# Patient Record
Sex: Male | Born: 1965
Health system: Southern US, Community
[De-identification: ages and names within clinical notes are randomized; demographics above are authoritative.]

## PROBLEM LIST (undated history)

## (undated) DIAGNOSIS — E119 Type 2 diabetes mellitus without complications: Secondary | ICD-10-CM

## (undated) DIAGNOSIS — I10 Essential (primary) hypertension: Secondary | ICD-10-CM

## (undated) HISTORY — DX: Essential (primary) hypertension: I10

## (undated) HISTORY — PX: SHOULDER SURGERY: SHX246

---

## 2002-01-15 ENCOUNTER — Emergency Department: Admission: EM | Admit: 2002-01-15 | Discharge: 2002-01-15 | Payer: Self-pay | Admitting: Emergency Medicine

## 2002-01-15 ENCOUNTER — Encounter: Payer: Self-pay | Admitting: Emergency Medicine

## 2007-03-07 ENCOUNTER — Emergency Department (HOSPITAL_COMMUNITY): Admission: EM | Admit: 2007-03-07 | Discharge: 2007-03-07 | Payer: Self-pay | Admitting: Emergency Medicine

## 2007-04-06 ENCOUNTER — Emergency Department (HOSPITAL_COMMUNITY): Admission: EM | Admit: 2007-04-06 | Discharge: 2007-04-07 | Payer: Self-pay | Admitting: Emergency Medicine

## 2007-08-04 ENCOUNTER — Emergency Department (HOSPITAL_COMMUNITY): Admission: EM | Admit: 2007-08-04 | Discharge: 2007-08-04 | Payer: Self-pay | Admitting: Emergency Medicine

## 2007-08-08 ENCOUNTER — Emergency Department (HOSPITAL_COMMUNITY): Admission: EM | Admit: 2007-08-08 | Discharge: 2007-08-08 | Payer: Self-pay | Admitting: Family Medicine

## 2010-01-08 ENCOUNTER — Emergency Department (HOSPITAL_COMMUNITY): Admission: EM | Admit: 2010-01-08 | Discharge: 2010-01-08 | Payer: Self-pay | Admitting: Emergency Medicine

## 2011-06-09 LAB — WOUND CULTURE

## 2011-06-17 LAB — BASIC METABOLIC PANEL
BUN: 3 — ABNORMAL LOW
CO2: 28
Calcium: 9.2
Chloride: 108
Creatinine, Ser: 0.87
GFR calc Af Amer: >60
GFR calc non Af Amer: >60
Glucose, Bld: 96
Potassium: 3.6
Sodium: 143

## 2011-06-17 LAB — CBC
HCT: 38.6 — ABNORMAL LOW
Hemoglobin: 12.9 — ABNORMAL LOW
MCHC: 33.4
MCV: 89.9
Platelets: 271
RBC: 4.29
RDW: 14.2 — ABNORMAL HIGH
WBC: 9.3

## 2011-06-17 LAB — DIFFERENTIAL
Basophils Absolute: 0
Basophils Relative: 0
Lymphocytes Relative: 14
Monocytes Absolute: 0.6
Neutro Abs: 7.3
Neutrophils Relative %: 78 — ABNORMAL HIGH

## 2014-08-22 ENCOUNTER — Emergency Department (HOSPITAL_COMMUNITY): Payer: Self-pay

## 2014-08-22 ENCOUNTER — Emergency Department (HOSPITAL_COMMUNITY)
Admission: EM | Admit: 2014-08-22 | Discharge: 2014-08-22 | Disposition: A | Discharge status: Home / Self Care | Payer: Self-pay | Attending: Emergency Medicine | Admitting: Emergency Medicine

## 2014-08-22 ENCOUNTER — Encounter (HOSPITAL_COMMUNITY): Payer: Self-pay

## 2014-08-22 DIAGNOSIS — R0789 Other chest pain: Secondary | ICD-10-CM | POA: Insufficient documentation

## 2014-08-22 DIAGNOSIS — R079 Chest pain, unspecified: Secondary | ICD-10-CM

## 2014-08-22 DIAGNOSIS — R10819 Abdominal tenderness, unspecified site: Secondary | ICD-10-CM | POA: Insufficient documentation

## 2014-08-22 LAB — CBC WITH DIFFERENTIAL/PLATELET
BASOS ABS: 0 10*3/uL (ref 0.0–0.1)
BASOS PCT: 0 % (ref 0–1)
EOS ABS: 0 10*3/uL (ref 0.0–0.7)
EOS PCT: 0 % (ref 0–5)
HCT: 43.9 % (ref 39.0–52.0)
Hemoglobin: 14.5 g/dL (ref 13.0–17.0)
LYMPHS ABS: 1.4 10*3/uL (ref 0.7–4.0)
Lymphocytes Relative: 15 % (ref 12–46)
MCH: 30.1 pg (ref 26.0–34.0)
MCHC: 33 g/dL (ref 30.0–36.0)
MCV: 91.1 fL (ref 78.0–100.0)
Monocytes Absolute: 0.4 10*3/uL (ref 0.1–1.0)
Monocytes Relative: 5 % (ref 3–12)
Neutro Abs: 7.1 10*3/uL (ref 1.7–7.7)
Neutrophils Relative %: 80 % — ABNORMAL HIGH (ref 43–77)
PLATELETS: 295 10*3/uL (ref 150–400)
RBC: 4.82 MIL/uL (ref 4.22–5.81)
RDW: 14.3 % (ref 11.5–15.5)
WBC: 8.9 10*3/uL (ref 4.0–10.5)

## 2014-08-22 LAB — RAPID URINE DRUG SCREEN, HOSP PERFORMED
AMPHETAMINES: NOT DETECTED
Barbiturates: NOT DETECTED
Benzodiazepines: NOT DETECTED
Cocaine: NOT DETECTED
OPIATES: NOT DETECTED
Tetrahydrocannabinol: NOT DETECTED

## 2014-08-22 LAB — COMPREHENSIVE METABOLIC PANEL
ALT: 52 U/L (ref 0–53)
AST: 36 U/L (ref 0–37)
Albumin: 4 g/dL (ref 3.5–5.2)
Alkaline Phosphatase: 69 U/L (ref 39–117)
Anion gap: 5 (ref 5–15)
BUN: 12 mg/dL (ref 6–23)
CALCIUM: 8.9 mg/dL (ref 8.4–10.5)
CO2: 25 mmol/L (ref 19–32)
Chloride: 109 mEq/L (ref 96–112)
Creatinine, Ser: 0.95 mg/dL (ref 0.50–1.35)
GFR calc non Af Amer: >90 mL/min (ref >90)
GLUCOSE: 159 mg/dL — AB (ref 70–99)
Potassium: 3.7 mmol/L (ref 3.5–5.1)
SODIUM: 139 mmol/L (ref 135–145)
TOTAL PROTEIN: 7.5 g/dL (ref 6.0–8.3)
Total Bilirubin: 0.7 mg/dL (ref 0.3–1.2)

## 2014-08-22 LAB — URINALYSIS, ROUTINE W REFLEX MICROSCOPIC
Bilirubin Urine: NEGATIVE
GLUCOSE, UA: NEGATIVE mg/dL
Hgb urine dipstick: NEGATIVE
Ketones, ur: NEGATIVE mg/dL
LEUKOCYTES UA: NEGATIVE
Nitrite: NEGATIVE
PROTEIN: 30 mg/dL — AB
Specific Gravity, Urine: >1.03 — ABNORMAL HIGH (ref 1.005–1.030)
UROBILINOGEN UA: 0.2 mg/dL (ref 0.0–1.0)
pH: 5.5 (ref 5.0–8.0)

## 2014-08-22 LAB — TROPONIN I: Troponin I: <0.03 ng/mL (ref <0.031)

## 2014-08-22 LAB — URINE MICROSCOPIC-ADD ON
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
## 2014-08-22 NOTE — Discharge Instructions (Signed)
Follow-up with the physician of your choice, as needed for problems.   Chest Pain (Nonspecific) It is often hard to give a specific diagnosis for the cause of chest pain. There is always a chance that your pain could be related to something serious, such as a heart attack or a blood clot in the lungs. You need to follow up with your health care provider for further evaluation. CAUSES   Heartburn.  Pneumonia or bronchitis.  Anxiety or stress.  Inflammation around your heart (pericarditis) or lung (pleuritis or pleurisy).  A blood clot in the lung.  A collapsed lung (pneumothorax). It can develop suddenly on its own (spontaneous pneumothorax) or from trauma to the chest.  Shingles infection (herpes zoster virus). The chest wall is composed of bones, muscles, and cartilage. Any of these can be the source of the pain.  The bones can be bruised by injury.  The muscles or cartilage can be strained by coughing or overwork.  The cartilage can be affected by inflammation and become sore (costochondritis). DIAGNOSIS  Lab tests or other studies may be needed to find the cause of your pain. Your health care provider may have you take a test called an ambulatory electrocardiogram (ECG). An ECG records your heartbeat patterns over a 24-hour period. You may also have other tests, such as:  Transthoracic echocardiogram (TTE). During echocardiography, sound waves are used to evaluate how blood flows through your heart.  Transesophageal echocardiogram (TEE).  Cardiac monitoring. This allows your health care provider to monitor your heart rate and rhythm in real time.  Holter monitor. This is a portable device that records your heartbeat and can help diagnose heart arrhythmias. It allows your health care provider to track your heart activity for several days, if needed.  Stress tests by exercise or by giving medicine that makes the heart beat faster. TREATMENT   Treatment depends on what may be  causing your chest pain. Treatment may include:  Acid blockers for heartburn.  Anti-inflammatory medicine.  Pain medicine for inflammatory conditions.  Antibiotics if an infection is present.  You may be advised to change lifestyle habits. This includes stopping smoking and avoiding alcohol, caffeine, and chocolate.  You may be advised to keep your head raised (elevated) when sleeping. This reduces the chance of acid going backward from your stomach into your esophagus. Most of the time, nonspecific chest pain will improve within 2-3 days with rest and mild pain medicine.  HOME CARE INSTRUCTIONS   If antibiotics were prescribed, take them as directed. Finish them even if you start to feel better.  For the next few days, avoid physical activities that bring on chest pain. Continue physical activities as directed.  Do not use any tobacco products, including cigarettes, chewing tobacco, or electronic cigarettes.  Avoid drinking alcohol.  Only take medicine as directed by your health care provider.  Follow your health care provider's suggestions for further testing if your chest pain does not go away.  Keep any follow-up appointments you made. If you do not go to an appointment, you could develop lasting (chronic) problems with pain. If there is any problem keeping an appointment, call to reschedule. SEEK MEDICAL CARE IF:   Your chest pain does not go away, even after treatment.  You have a rash with blisters on your chest.  You have a fever. SEEK IMMEDIATE MEDICAL CARE IF:   You have increased chest pain or pain that spreads to your arm, neck, jaw, back, or abdomen.  You  have shortness of breath.  You have an increasing cough, or you cough up blood.  You have severe back or abdominal pain.  You feel nauseous or vomit.  You have severe weakness.  You faint.  You have chills. This is an emergency. Do not wait to see if the pain will go away. Get medical help at once.  Call your local emergency services (911 in [place] Do not drive yourself to the hospital. MAKE SURE YOU:   Understand these instructions.  Will watch your condition.  Will get help right away if you are not doing well or get worse. Document Released: 05/28/2005 Document Revised: 08/23/2013 Document Reviewed: 03/23/2008 [facility] Patient Information 2015 [employer] [place]. This information is not intended to replace advice given to you by your health care provider. Make sure you discuss any questions you have with your health care provider.
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
## 2014-10-02 DIAGNOSIS — F329 Major depressive disorder, single episode, unspecified: Secondary | ICD-10-CM | POA: Insufficient documentation

## 2015-07-09 IMAGING — CR DG CHEST 2V
2 series · 2 of 2 positions shown · non-contrast
Comparison: None.

CLINICAL DATA: Chest pain.

EXAM:
CHEST  2 VIEW

[view not recorded (1 of 2)]
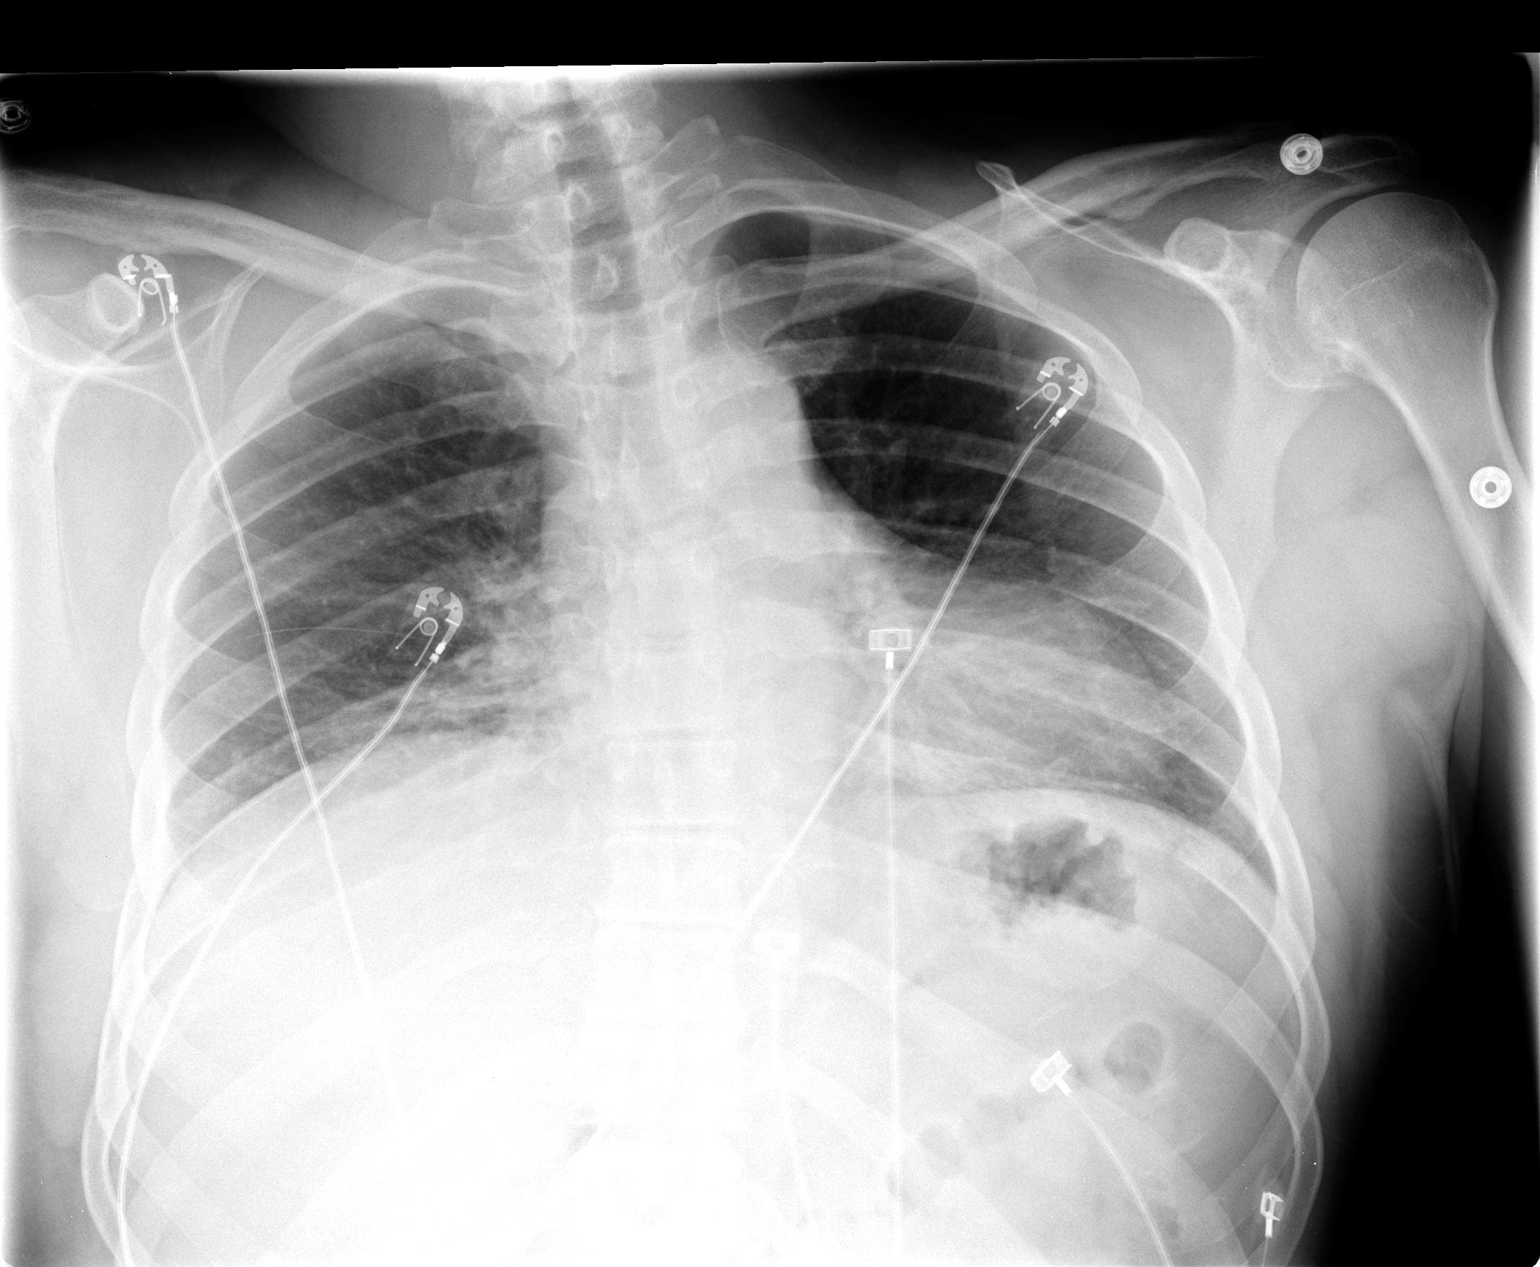

[view not recorded (2 of 2)]
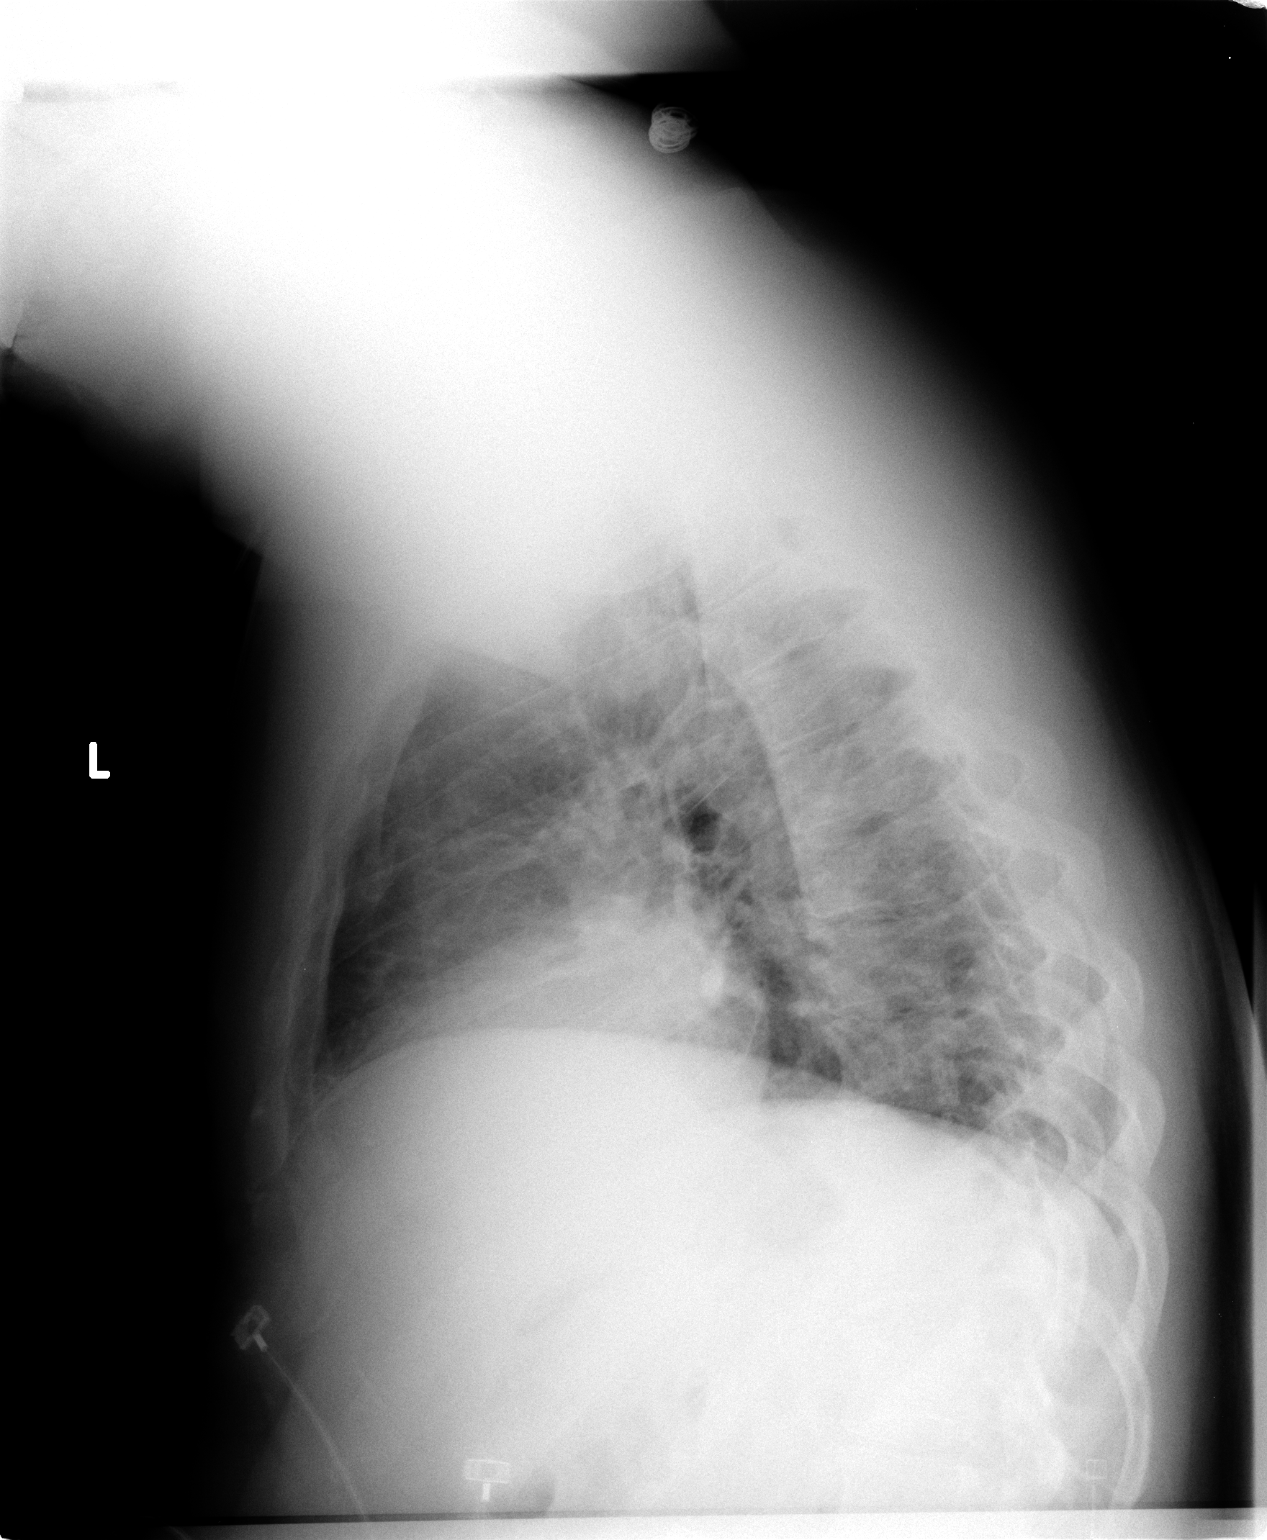

[2 of 2 positions shown; findings below may reference images not displayed]

FINDINGS: Very low lung volumes leading to crowding of bronchovascular
markings and mild bibasilar atelectasis. No pneumothorax, pleural
effusion or confluent airspace disease. Accentuation of the cardiac
size, likely sequela of low lung volumes. No acute osseous
abnormalities are seen.
IMPRESSION: Hypoventilatory chest with mild bibasilar atelectasis.

## 2017-05-27 ENCOUNTER — Emergency Department (HOSPITAL_COMMUNITY)
Admission: EM | Admit: 2017-05-27 | Discharge: 2017-05-28 | Disposition: A | Discharge status: Home / Self Care | Payer: Self-pay | Attending: Emergency Medicine | Admitting: Emergency Medicine

## 2017-05-27 ENCOUNTER — Encounter (HOSPITAL_COMMUNITY): Payer: Self-pay | Admitting: Emergency Medicine

## 2017-05-27 DIAGNOSIS — H109 Unspecified conjunctivitis: Secondary | ICD-10-CM

## 2017-05-27 MED ORDER — ERYTHROMYCIN 5 MG/GM OP OINT
1.0000 "application " | TOPICAL_OINTMENT | Freq: Once | OPHTHALMIC | Status: AC
  Administered 2017-05-28: 1 via OPHTHALMIC
  Filled 2017-05-27: qty 3.5

## 2017-05-27 MED ORDER — TETRACAINE HCL 0.5 % OP SOLN
2.0000 [drp] | Freq: Once | OPHTHALMIC | Status: AC
  Administered 2017-05-27: 2 [drp] via OPHTHALMIC
  Filled 2017-05-27: qty 4

## 2017-05-27 MED ORDER — FLUORESCEIN SODIUM 1 MG OP STRP
1.0000 | ORAL_STRIP | Freq: Once | OPHTHALMIC | Status: AC
  Administered 2017-05-27: 1 via OPHTHALMIC
  Filled 2017-05-27: qty 1

## 2017-05-27 MED ORDER — ERYTHROMYCIN 5 MG/GM OP OINT: 1.0000 "application " | TOPICAL_OINTMENT | Freq: Once | OPHTHALMIC | Status: DC

## 2017-05-27 NOTE — ED Provider Notes (Signed)
[facility]-EMERGENCY DEPT Provider Note   CSN: [id number] Arrival date & time: 05/27/17  2011     History   Chief Complaint Chief Complaint  Patient presents with  . Conjunctivitis    HPI Jesse Weiss is a 51 y.o. male without significant past medical history, presented to the ED for acute onset of bilateral eye itching and discharge that began a few days ago. Patient states symptoms initially began in the right eye, however the next day the left eye began feeling the same. He states eyes are itchy, and wakes up in the morning with crust and purulent drainage coming from his eyes. He denies vision changes, headache, eye trauma, foreign body sensation, fever or other complaints today. He does not wear contact lenses or glasses.  The history is provided by the patient.    History reviewed. No pertinent past medical history.  There are no active problems to display for this patient.   Past Surgical History:  Procedure Laterality Date  . SHOULDER SURGERY         Home Medications    Prior to Admission medications   Medication Sig Start Date End Date Taking? Authorizing Provider  acetaminophen (TYLENOL) 500 MG tablet Take 500 mg by mouth every 6 (six) hours as needed for mild pain or moderate pain.    [provider]  Multiple Vitamin (MULTIVITAMIN WITH MINERALS) TABS tablet Take 1 tablet by mouth daily.    [provider]    Family History History reviewed. No pertinent family history.  Social History Social History  Substance Use Topics  . Smoking status: Never Smoker  . Smokeless tobacco: Never Used  . Alcohol use No     Allergies   Patient has no known allergies.   Review of Systems Review of Systems  Constitutional: Negative for fever.  Eyes: Positive for discharge, redness and itching. Negative for photophobia and visual disturbance.  Neurological: Negative for headaches.     Physical Exam Updated Vital Signs BP 127/80 (BP Location: Right  Wrist)   Pulse 82   Temp 98.3 F (36.8 C) (Oral)   Resp 14   Ht  (1.803 m)   Wt 89.8 kg (198 lb)   SpO2 100%   BMI 27.62 kg/m   Physical Exam  Constitutional: He appears well-developed and well-nourished. No distress.  HENT:  Head: Normocephalic and atraumatic.  Eyes: Pupils are equal, round, and reactive to light. Conjunctivae, EOM and lids are normal. Lids are everted and swept, no foreign bodies found. Right eye exhibits discharge. Left eye exhibits discharge.  Purulent crusting discharge from bilateral eyes. Conjunctiva bilaterally is red, sparing the limbus. No foreign bodies visualized.  Bilateral eyes visualized under Woods lamp with fluorescein stain. No fluorescein uptake noted. No Weiss sign.  Cardiovascular: Normal rate.   Pulmonary/Chest: Effort normal.  Psychiatric: He has a normal mood and affect. His behavior is normal.  Nursing note and vitals reviewed.    ED Treatments / Results  Labs (all labs ordered are listed, but only abnormal results are displayed) Labs Reviewed - No data to display  EKG  EKG Interpretation None       Radiology No results found.  Procedures Procedures (including critical care time)  Medications Ordered in ED Medications  fluorescein ophthalmic strip 1 strip (1 strip Both Eyes Given 05/27/17 2322)  tetracaine (PONTOCAINE) 0.5 % ophthalmic solution 2 drop (2 drops Both Eyes Given 05/27/17 2322)  erythromycin ophthalmic ointment 1 application (1 application Both Eyes Given 05/28/17 0015)  Initial Impression / Assessment and Plan / ED Course  I have reviewed the triage vital signs and the nursing notes.  Pertinent labs & imaging results that were available during my care of the patient were reviewed by me and considered in my medical decision making (see chart for details).     Pt presents with symptoms consistent with bacterial conjunctivitis.  Purulent discharge exam.  No corneal abrasions, entrapment, consensual  photophobia, or dendritic staining with fluorescein study.  Presentation non-concerning for iritis, corneal abrasions, or HSV.  No evidence of preseptal or orbital cellulitis.  Pt is not a contact lens wearer.  Patient will be given erythromycin ophthalmic.  Personal hygiene and frequent handwashing discussed.  Patient advised to followup with ophthalmologist for reevaluation in several days..  Patient verbalizes understanding and is agreeable with discharge.  Discussed results, findings, treatment and follow up. Patient advised of return precautions. Patient verbalized understanding and agreed with plan.  Final Clinical Impressions(s) / ED Diagnoses   Final diagnoses:  Bacterial conjunctivitis    New Prescriptions Discharge Medication List as of 05/27/2017 11:50 PM       Russo, [place] N, PA-C 05/28/17 1610    Alvira Monday, MD 05/28/17 [id number]

## 2017-05-27 NOTE — Discharge Instructions (Signed)
Please read instructions below. Apply 1/2 inch ribbon of ointment to both of your eyes, 4 times per day for 5 days. Wash your hands frequently, as this is very contagious. Appointment with the Eye specialist in 2 days for a recheck, as well as to evaluate your vision. Return to the ER for decrease in vision, severe headache, or new or concerning symptoms.

## 2017-05-27 NOTE — ED Triage Notes (Signed)
Patient reports bilateral eye redness with drainage and itching onset this week , denies eye injury / no vision loss or blurred vision .

## 2017-05-28 ENCOUNTER — Inpatient Hospital Stay (HOSPITAL_COMMUNITY)
Admission: EM | Admit: 2017-05-28 | Discharge: 2017-06-01 | DRG: 638 | Disposition: A | Discharge status: Home / Self Care | Payer: Self-pay | Attending: Internal Medicine | Admitting: Internal Medicine

## 2017-05-28 ENCOUNTER — Emergency Department (HOSPITAL_COMMUNITY): Payer: Self-pay

## 2017-05-28 ENCOUNTER — Inpatient Hospital Stay (HOSPITAL_COMMUNITY): Payer: Self-pay

## 2017-05-28 ENCOUNTER — Encounter (HOSPITAL_COMMUNITY): Payer: Self-pay

## 2017-05-28 DIAGNOSIS — E875 Hyperkalemia: Secondary | ICD-10-CM | POA: Diagnosis present

## 2017-05-28 DIAGNOSIS — Z8249 Family history of ischemic heart disease and other diseases of the circulatory system: Secondary | ICD-10-CM

## 2017-05-28 DIAGNOSIS — Z833 Family history of diabetes mellitus: Secondary | ICD-10-CM

## 2017-05-28 DIAGNOSIS — E1110 Type 2 diabetes mellitus with ketoacidosis without coma: Principal | ICD-10-CM | POA: Diagnosis present

## 2017-05-28 DIAGNOSIS — Z9889 Other specified postprocedural states: Secondary | ICD-10-CM

## 2017-05-28 DIAGNOSIS — E860 Dehydration: Secondary | ICD-10-CM | POA: Diagnosis present

## 2017-05-28 DIAGNOSIS — N179 Acute kidney failure, unspecified: Secondary | ICD-10-CM | POA: Diagnosis present

## 2017-05-28 DIAGNOSIS — H109 Unspecified conjunctivitis: Secondary | ICD-10-CM | POA: Diagnosis present

## 2017-05-28 LAB — CBC
HEMATOCRIT: 45 % (ref 39.0–52.0)
HEMOGLOBIN: 15 g/dL (ref 13.0–17.0)
MCH: 30.5 pg (ref 26.0–34.0)
MCHC: 33.3 g/dL (ref 30.0–36.0)
MCV: 91.5 fL (ref 78.0–100.0)
Platelets: 250 10*3/uL (ref 150–400)
RBC: 4.92 MIL/uL (ref 4.22–5.81)
RDW: 13.4 % (ref 11.5–15.5)
WBC: 7.6 10*3/uL (ref 4.0–10.5)

## 2017-05-28 LAB — I-STAT VENOUS BLOOD GAS, ED
Acid-base deficit: 12 mmol/L — ABNORMAL HIGH (ref 0.0–2.0)
BICARBONATE: 13.7 mmol/L — AB (ref 20.0–28.0)
O2 Saturation: 63 %
PCO2 VEN: 30.1 mmHg — AB (ref 44.0–60.0)
PH VEN: 7.265 (ref 7.250–7.430)
PO2 VEN: 37 mmHg (ref 32.0–45.0)
TCO2: 15 mmol/L — ABNORMAL LOW (ref 22–32)

## 2017-05-28 LAB — URINALYSIS, ROUTINE W REFLEX MICROSCOPIC
BACTERIA UA: NONE SEEN
BILIRUBIN URINE: NEGATIVE
Glucose, UA: >=500 mg/dL — AB
HGB URINE DIPSTICK: NEGATIVE
KETONES UR: 5 mg/dL — AB
LEUKOCYTES UA: NEGATIVE
Nitrite: NEGATIVE
PH: 5 (ref 5.0–8.0)
Protein, ur: NEGATIVE mg/dL
SPECIFIC GRAVITY, URINE: 1 — AB (ref 1.005–1.030)
Squamous Epithelial / LPF: NONE SEEN
WBC UA: NONE SEEN WBC/hpf (ref 0–5)

## 2017-05-28 LAB — BASIC METABOLIC PANEL
ANION GAP: 22 — AB (ref 5–15)
BUN: 23 mg/dL — ABNORMAL HIGH (ref 6–20)
CALCIUM: 10.1 mg/dL (ref 8.9–10.3)
CO2: 11 mmol/L — ABNORMAL LOW (ref 22–32)
Chloride: 96 mmol/L — ABNORMAL LOW (ref 101–111)
Creatinine, Ser: 1.65 mg/dL — ABNORMAL HIGH (ref 0.61–1.24)
GFR calc Af Amer: 54 mL/min — ABNORMAL LOW (ref >60)
GFR, EST NON AFRICAN AMERICAN: 47 mL/min — AB (ref >60)
Glucose, Bld: 884 mg/dL (ref 65–99)
POTASSIUM: 5.9 mmol/L — AB (ref 3.5–5.1)
Sodium: 129 mmol/L — ABNORMAL LOW (ref 135–145)

## 2017-05-28 LAB — CBG MONITORING, ED
GLUCOSE-CAPILLARY: 507 mg/dL — AB (ref 65–99)
Glucose-Capillary: >600 mg/dL (ref 65–99)

## 2017-05-28 MED ORDER — SODIUM CHLORIDE 0.9 % IV BOLUS (SEPSIS)
1000.0000 mL | Freq: Once | INTRAVENOUS | Status: AC
  Administered 2017-05-28: 1000 mL via INTRAVENOUS

## 2017-05-28 MED ORDER — DEXTROSE-NACL 5-0.45 % IV SOLN
INTRAVENOUS | Status: DC
  Administered 2017-05-29: 04:00:00 via INTRAVENOUS

## 2017-05-28 MED ORDER — SODIUM CHLORIDE 0.9 % IV SOLN
INTRAVENOUS | Status: DC
  Administered 2017-05-28: 3.6 [IU]/h via INTRAVENOUS
  Filled 2017-05-28: qty 1

## 2017-05-28 NOTE — ED Notes (Signed)
No answer when called pt for room. Pt not seen in waiting room.

## 2017-05-28 NOTE — ED Notes (Signed)
Pt alert and oriented x's 3.  St's he has felt really fatigued for past couple of weeks.  Pt denies any hx of diabetes

## 2017-05-28 NOTE — ED Notes (Signed)
Dr Goldston given a copy of venous blood gas 

## 2017-05-28 NOTE — H&P (Signed)
TRH H&P   Patient Demographics:    Jesse Weiss, is a 51 y.o. male  MRN: [id number]   DOB - 05/05/1966  Admit Date - 05/28/2017  Outpatient Primary MD for the patient is Patient, No Pcp Per  Referring MD/NP/PA: Dr. Criss Alvine  Outpatient Specialists: none  Patient coming from: home  Chief Complaint  Patient presents with  . Weakness      HPI:    Jesse Weiss  is a 51 y.o. male, c/o fatigue for a few weeks and polydipsia, and polyuria. + n/v, no blood.  Pt presented for evaluation.   In ED, pt noted to have glucose 884, and be in DKA. And mild ARF bu/creatinine 23/1.65, and K=5.9 and Na 129   Review of systems:    In addition to the HPI above,  No Fever-chills, No Headache, + blurred vision No problems swallowing food or Liquids, No Chest pain, Cough or Shortness of Breath, No Abdominal pain, No Nausea or Vommitting, Bowel movements are regular, No Blood in stool or Urine, No dysuria, No new skin rashes or bruises, No new joints pains-aches,  No new weakness, tingling, numbness in any extremity, No recent weight gain or loss,  No significant Mental Stressors.  A full 10 point Review of Systems was done, except as stated above, all other Review of Systems were negative.   With Past History of the following :    History reviewed. No pertinent past medical history.    Past Surgical History:  Procedure Laterality Date  . SHOULDER SURGERY        Social History:     Social History  Substance Use Topics  . Smoking status: Never Smoker  . Smokeless tobacco: Never Used  . Alcohol use No     Lives -  At home Mobility -   Walks by self   Family History :     Family History  Problem Relation Age of Onset  . Hypertension Mother   . Diabetes Father       Home Medications:   Prior to Admission medications   Medication Sig Start Date End Date  Taking? Authorizing Provider  acetaminophen (TYLENOL) 500 MG tablet Take 500 mg by mouth every 6 (six) hours as needed for mild pain or moderate pain.    [provider]  Multiple Vitamin (MULTIVITAMIN WITH MINERALS) TABS tablet Take 1 tablet by mouth daily.    [provider]     Allergies:    No Known Allergies   Physical Exam:   Vitals  Blood pressure 118/85, pulse 81, temperature 97.6 F (36.4 C), temperature source Oral, resp. rate (!) 21, height  (1.803 m), weight 84.4 kg (186 lb), SpO2 97 %.   1. General lying in bed in NAD,    2. Normal affect and insight, Not Suicidal or Homicidal, Awake Alert, Oriented X 3.  3. No F.N  deficits, ALL C.Nerves Intact, Strength 5/5 all 4 extremities, Sensation intact all 4 extremities, Plantars down going.  4. Ears and Eyes appear Normal, Conjunctivae clear, PERRLA.  Mucous membranes dry  5. Supple Neck, No JVD, No cervical lymphadenopathy appriciated, No Carotid Bruits.  6. Symmetrical Chest wall movement, Good air movement bilaterally, CTAB.  7. RRR, No Gallops, Rubs or Murmurs, No Parasternal Heave.  8. Positive Bowel Sounds, Abdomen Soft, No tenderness, No organomegaly appriciated,No rebound -guarding or rigidity.  9.  No Cyanosis, Normal Skin Turgor, No Skin Rash or Bruise.  10. Good muscle tone,  joints appear normal , no effusions, Normal ROM.  11. No Palpable Lymph Nodes in Neck or Axillae    Data Review:    CBC  Recent Labs Lab 05/28/17 1603  WBC 7.6  HGB 15.0  HCT 45.0  PLT 250  MCV 91.5  MCH 30.5  MCHC 33.3  RDW 13.4   ------------------------------------------------------------------------------------------------------------------  Chemistries   Recent Labs Lab 05/28/17 1603  NA 129*  K 5.9*  CL 96*  CO2 11*  GLUCOSE 884*  BUN 23*  CREATININE 1.65*  CALCIUM 10.1    ------------------------------------------------------------------------------------------------------------------ estimated creatinine clearance is 56.4 mL/min (A) (by C-G formula based on SCr of 1.65 mg/dL (H)). ------------------------------------------------------------------------------------------------------------------ No results for input(s): TSH, T4TOTAL, T3FREE, THYROIDAB in the last 72 hours.  Invalid input(s): FREET3  Coagulation profile No results for input(s): INR, PROTIME in the last 168 hours. ------------------------------------------------------------------------------------------------------------------- No results for input(s): DDIMER in the last 72 hours. -------------------------------------------------------------------------------------------------------------------  Cardiac Enzymes No results for input(s): CKMB, TROPONINI, MYOGLOBIN in the last 168 hours.  Invalid input(s): CK ------------------------------------------------------------------------------------------------------------------ No results found for: BNP   ---------------------------------------------------------------------------------------------------------------  Urinalysis    Component Value Date/Time   COLORURINE COLORLESS (A) 05/28/2017 1626   APPEARANCEUR CLEAR 05/28/2017 1626   LABSPEC 1.000 (L) 05/28/2017 1626   PHURINE 5.0 05/28/2017 1626   GLUCOSEU >=500 (A) 05/28/2017 1626   HGBUR NEGATIVE 05/28/2017 1626   BILIRUBINUR NEGATIVE 05/28/2017 1626   KETONESUR 5 (A) 05/28/2017 1626   PROTEINUR NEGATIVE 05/28/2017 1626   UROBILINOGEN 0.2 08/22/2014 1607   NITRITE NEGATIVE 05/28/2017 1626   LEUKOCYTESUR NEGATIVE 05/28/2017 1626    ----------------------------------------------------------------------------------------------------------------   Imaging Results:    Dg Chest 2 View  Result Date: 05/28/2017 CLINICAL DATA:  Dyspnea and cough. Fatigue, nausea and increased thirst  x1 week. EXAM: CHEST  2 VIEW COMPARISON:  08/22/2014 FINDINGS: The heart size and mediastinal contours are within normal limits. Both lungs are clear. The visualized skeletal structures are unremarkable. IMPRESSION: No active cardiopulmonary disease. Electronically Signed   By: Tollie Eth M.D.   On: 05/28/2017 19:36       Assessment & Plan:    Principal Problem:   DKA (diabetic ketoacidoses) (HCC) Active Problems:   Hyperkalemia   ARF (acute renal failure) (HCC)   Diabetes (HCC)    DKA Ns iv  NPO Insulin iv  Hyperkalemia Calcium gluconate iv Sodium bicarb iv Kayexalate Check cmp in am  ARF Check urine sodium, urine creatinine, urine eosinophils Check renal ultrasound Hydrate with ns iv Check cmp in am  Pseudohyponatremia Check cmp in am     DVT Prophylaxis   Lovenox - SCDs   AM Labs Ordered, also please review Full Orders  Family Communication: Admission, patients condition and plan of care including tests being ordered have been discussed with the patient who indicate understanding and agree with the plan and Code Status.  Code Status FULL CODE  Likely DC to  home  Condition  Critical  Consults called: none  Admission status: inpatient  Time spent in minutes :  45   Pearson Grippe M.D on 05/28/2017 at 8:40 PM  Between 7am to 7pm - Pager - [phone]. After 7pm go to [email or web address] - password [facility]  [facility] - Office  [phone]

## 2017-05-28 NOTE — ED Provider Notes (Signed)
[facility]-EMERGENCY DEPT Provider Note   CSN: [id number] Arrival date & time: 05/28/17  1527     History   Chief Complaint Chief Complaint  Patient presents with  . Weakness    HPI Jesse Weiss is a 51 y.o. male.  HPI  51 year old male presents with weakness. He states he's been doing this way since last week. He feels an overall fatigue and tiredness. He states he is concerned his sugar might be high because he has a family history of diabetes. However he has no history of diabetes. He has been having dry mouth and increased thirst. He felt some subjective fevers before and has had a cough with some green sputum for about one week. Some associated shortness of breath but no chest pain. No leg swelling. No vomiting or diarrhea or abdominal pain. He was seen in the emergency room yesterday for conjunctivitis and has been using the erythromycin ointment as prescribed.  History reviewed. No pertinent past medical history.  There are no active problems to display for this patient.   Past Surgical History:  Procedure Laterality Date  . SHOULDER SURGERY         Home Medications    Prior to Admission medications   Medication Sig Start Date End Date Taking? Authorizing Provider  acetaminophen (TYLENOL) 500 MG tablet Take 500 mg by mouth every 6 (six) hours as needed for mild pain or moderate pain.    [provider]  Multiple Vitamin (MULTIVITAMIN WITH MINERALS) TABS tablet Take 1 tablet by mouth daily.    [provider]    Family History History reviewed. No pertinent family history.  Social History Social History  Substance Use Topics  . Smoking status: Never Smoker  . Smokeless tobacco: Never Used  . Alcohol use No     Allergies   Patient has no known allergies.   Review of Systems Review of Systems  Constitutional: Positive for chills, fatigue and fever.  Respiratory: Positive for cough and shortness of breath.   Cardiovascular: Negative for  chest pain and leg swelling.  Gastrointestinal: Negative for abdominal pain, diarrhea and vomiting.  Endocrine: Positive for polydipsia.  All other systems reviewed and are negative.    Physical Exam Updated Vital Signs BP (!) 147/70 (BP Location: Right Arm)   Pulse 89   Temp 97.6 F (36.4 C) (Oral)   Resp 18   Ht  (1.803 m)   Wt 84.4 kg (186 lb)   SpO2 99%   BMI 25.94 kg/m   Physical Exam  Constitutional: He is oriented to person, place, and time. He appears well-developed and well-nourished. No distress.  HENT:  Head: Normocephalic and atraumatic.  Right Ear: External ear normal.  Left Ear: External ear normal.  Nose: Nose normal.  Mouth/Throat: Mucous membranes are dry.  Eyes: Right eye exhibits discharge. Left eye exhibits discharge.  Mild conjunctival injection in both eyes. Mild green discharge at the corners of both eyes  Neck: Neck supple.  Cardiovascular: Normal rate, regular rhythm and normal heart sounds.   Pulmonary/Chest: Effort normal and breath sounds normal. He has no wheezes. He has no rales.  Abdominal: Soft. There is no tenderness.  Musculoskeletal: He exhibits no edema.  Neurological: He is alert and oriented to person, place, and time.  Skin: Skin is warm and dry. He is not diaphoretic.  Nursing note and vitals reviewed.    ED Treatments / Results  Labs (all labs ordered are listed, but only abnormal results are displayed) Labs  Reviewed  BASIC METABOLIC PANEL - Abnormal; Notable for the following:       Result Value   Sodium 129 (*)    Potassium 5.9 (*)    Chloride 96 (*)    CO2 11 (*)    Glucose, Bld 884 (*)    BUN 23 (*)    Creatinine, Ser 1.65 (*)    GFR calc non Af Amer 47 (*)    GFR calc Af Amer 54 (*)    Anion gap 22 (*)    All other components within normal limits  URINALYSIS, ROUTINE W REFLEX MICROSCOPIC - Abnormal; Notable for the following:    Color, Urine COLORLESS (*)    Specific Gravity, Urine 1.000 (*)    Glucose,  UA >=500 (*)    Ketones, ur 5 (*)    All other components within normal limits  CBG MONITORING, ED - Abnormal; Notable for the following:    Glucose-Capillary >600 (*)    All other components within normal limits  CBC  I-STAT VENOUS BLOOD GAS, ED    EKG  EKG Interpretation None       Radiology No results found.  Procedures .Critical Care Performed by: Pricilla Loveless Authorized by: Pricilla Loveless   Critical care provider statement:    Critical care time (minutes):  35   Critical care was necessary to treat or prevent imminent or life-threatening deterioration of the following conditions:  Dehydration, endocrine crisis and renal failure   Critical care was time spent personally by me on the following activities:  Development of treatment plan with patient or surrogate, discussions with consultants, evaluation of patient's response to treatment, examination of patient, obtaining history from patient or surrogate, re-evaluation of patient's condition, ordering and performing treatments and interventions, ordering and review of laboratory studies and ordering and review of radiographic studies    (including critical care time)   Medications Ordered in ED Medications  sodium chloride 0.9 % bolus 1,000 mL (not administered)  sodium chloride 0.9 % bolus 1,000 mL (not administered)     Initial Impression / Assessment and Plan / ED Course  I have reviewed the triage vital signs and the nursing notes.  Pertinent labs & imaging results that were available during my care of the patient were reviewed by me and considered in my medical decision making (see chart for details).     Patient has new onset diabetes with DKA. He is overall well appearing besides appearing dehydrated. Hemodynamically stable. Started on IV fluid boluses, then insulin. Mild hyperkalemia but less than 6. Will admit to the hospitalist.   Final Clinical Impressions(s) / ED Diagnoses   Final diagnoses:    Type 2 diabetes mellitus with ketoacidosis without coma, without long-term current use of insulin (HCC)  AKI (acute kidney injury) (HCC)    New Prescriptions New Prescriptions   No medications on file     Pricilla Loveless, MD 05/28/17 2334

## 2017-05-28 NOTE — ED Triage Notes (Signed)
Pt presents with 2 week h/o fatigue, weakness and decreased appetite.  Pt reports intermittent abdominal pain that worsens after eating, reports increased thirst and voiding.

## 2017-05-28 NOTE — ED Notes (Signed)
CBG: 417 

## 2017-05-28 NOTE — ED Notes (Signed)
CRITICAL VALUE ALERT  Critical Value:  CBG 884  Date & Time Notied:  05/28/2017  1638  Provider Notified: Dr. Jeraldine Loots  Orders Received/Actions taken: Will get pt to room as soon as possible

## 2017-05-28 NOTE — ED Notes (Signed)
IV attempted x's 3 without success. Another RN will attempt access.

## 2017-05-28 NOTE — ED Notes (Signed)
IV accidentally pulled out by pt.  While attempting to use urinal.

## 2017-05-29 DIAGNOSIS — E875 Hyperkalemia: Secondary | ICD-10-CM

## 2017-05-29 DIAGNOSIS — E1110 Type 2 diabetes mellitus with ketoacidosis without coma: Principal | ICD-10-CM

## 2017-05-29 DIAGNOSIS — N179 Acute kidney failure, unspecified: Secondary | ICD-10-CM

## 2017-05-29 LAB — COMPREHENSIVE METABOLIC PANEL
ALBUMIN: 3.5 g/dL (ref 3.5–5.0)
ALT: 23 U/L (ref 17–63)
ANION GAP: 8 (ref 5–15)
AST: 24 U/L (ref 15–41)
Alkaline Phosphatase: 71 U/L (ref 38–126)
BILIRUBIN TOTAL: 1.7 mg/dL — AB (ref 0.3–1.2)
BUN: 16 mg/dL (ref 6–20)
CO2: 19 mmol/L — ABNORMAL LOW (ref 22–32)
Calcium: 9 mg/dL (ref 8.9–10.3)
Chloride: 114 mmol/L — ABNORMAL HIGH (ref 101–111)
Creatinine, Ser: 1.08 mg/dL (ref 0.61–1.24)
GLUCOSE: 148 mg/dL — AB (ref 65–99)
POTASSIUM: 5.1 mmol/L (ref 3.5–5.1)
Sodium: 141 mmol/L (ref 135–145)
TOTAL PROTEIN: 6.6 g/dL (ref 6.5–8.1)

## 2017-05-29 LAB — CBG MONITORING, ED
GLUCOSE-CAPILLARY: 118 mg/dL — AB (ref 65–99)
GLUCOSE-CAPILLARY: 164 mg/dL — AB (ref 65–99)
GLUCOSE-CAPILLARY: 168 mg/dL — AB (ref 65–99)
GLUCOSE-CAPILLARY: 169 mg/dL — AB (ref 65–99)
GLUCOSE-CAPILLARY: 190 mg/dL — AB (ref 65–99)
GLUCOSE-CAPILLARY: 339 mg/dL — AB (ref 65–99)
Glucose-Capillary: 417 mg/dL — ABNORMAL HIGH (ref 65–99)
Glucose-Capillary: 339 mg/dL — ABNORMAL HIGH (ref 65–99)
Glucose-Capillary: 224 mg/dL — ABNORMAL HIGH (ref 65–99)
Glucose-Capillary: 185 mg/dL — ABNORMAL HIGH (ref 65–99)
Glucose-Capillary: 182 mg/dL — ABNORMAL HIGH (ref 65–99)
Glucose-Capillary: 121 mg/dL — ABNORMAL HIGH (ref 65–99)

## 2017-05-29 LAB — BASIC METABOLIC PANEL
Anion gap: 7 (ref 5–15)
BUN: 16 mg/dL (ref 6–20)
CHLORIDE: 107 mmol/L (ref 101–111)
CO2: 21 mmol/L — AB (ref 22–32)
CREATININE: 1.17 mg/dL (ref 0.61–1.24)
Calcium: 8.5 mg/dL — ABNORMAL LOW (ref 8.9–10.3)
GFR calc non Af Amer: >60 mL/min (ref >60)
Glucose, Bld: 360 mg/dL — ABNORMAL HIGH (ref 65–99)
POTASSIUM: 3.8 mmol/L (ref 3.5–5.1)
SODIUM: 135 mmol/L (ref 135–145)

## 2017-05-29 LAB — SODIUM, URINE, RANDOM

## 2017-05-29 LAB — GLUCOSE, CAPILLARY
GLUCOSE-CAPILLARY: 277 mg/dL — AB (ref 65–99)
Glucose-Capillary: 195 mg/dL — ABNORMAL HIGH (ref 65–99)

## 2017-05-29 LAB — MAGNESIUM: MAGNESIUM: 2.3 mg/dL (ref 1.7–2.4)

## 2017-05-29 LAB — HIV ANTIBODY (ROUTINE TESTING W REFLEX): HIV SCREEN 4TH GENERATION: NONREACTIVE

## 2017-05-29 MED ORDER — SODIUM CHLORIDE 0.9 % IV SOLN
INTRAVENOUS | Status: DC
  Administered 2017-05-29: 13:00:00 via INTRAVENOUS

## 2017-05-29 MED ORDER — METHOCARBAMOL 500 MG PO TABS
500.0000 mg | ORAL_TABLET | Freq: Four times a day (QID) | ORAL | Status: DC | PRN
  Administered 2017-05-29 (×3): 500 mg via ORAL
  Filled 2017-05-29 (×3): qty 1

## 2017-05-29 MED ORDER — SODIUM BICARBONATE 8.4 % IV SOLN: 50.0000 meq | Freq: Once | INTRAVENOUS | Status: DC

## 2017-05-29 MED ORDER — SODIUM POLYSTYRENE SULFONATE 15 GM/60ML PO SUSP: 15.0000 g | Freq: Once | ORAL | Status: DC

## 2017-05-29 MED ORDER — INSULIN DETEMIR 100 UNIT/ML ~~LOC~~ SOLN
15.0000 [IU] | Freq: Every day | SUBCUTANEOUS | Status: DC
  Administered 2017-05-29: 15 [IU] via SUBCUTANEOUS
  Filled 2017-05-29 (×2): qty 0.15

## 2017-05-29 MED ORDER — SODIUM CHLORIDE 0.9 % IV SOLN
1.0000 g | Freq: Once | INTRAVENOUS | Status: DC
  Filled 2017-05-29: qty 10

## 2017-05-29 MED ORDER — SODIUM BICARBONATE 4.2 % IV SOLN: 50.0000 meq | Freq: Once | INTRAVENOUS | Status: DC

## 2017-05-29 MED ORDER — LIVING WELL WITH DIABETES BOOK
Freq: Once | Status: AC
  Administered 2017-05-29: 18:00:00
  Filled 2017-05-29 (×2): qty 1

## 2017-05-29 MED ORDER — INSULIN STARTER KIT- SYRINGES (ENGLISH)
1.0000 | Freq: Once | Status: AC
  Administered 2017-05-30: 1
  Filled 2017-05-29 (×2): qty 1

## 2017-05-29 MED ORDER — TRAMADOL HCL 50 MG PO TABS: 50.0000 mg | ORAL_TABLET | Freq: Four times a day (QID) | ORAL | Status: DC | PRN

## 2017-05-29 MED ORDER — DEXTROSE-NACL 5-0.45 % IV SOLN
INTRAVENOUS | Status: DC
  Administered 2017-05-29: 08:00:00 via INTRAVENOUS

## 2017-05-29 MED ORDER — INSULIN ASPART 100 UNIT/ML ~~LOC~~ SOLN
0.0000 [IU] | Freq: Every day | SUBCUTANEOUS | Status: DC
  Administered 2017-05-30: 2 [IU] via SUBCUTANEOUS
  Administered 2017-05-31: 3 [IU] via SUBCUTANEOUS

## 2017-05-29 MED ORDER — ENOXAPARIN SODIUM 30 MG/0.3ML ~~LOC~~ SOLN
30.0000 mg | SUBCUTANEOUS | Status: DC
  Administered 2017-05-29: 30 mg via SUBCUTANEOUS
  Filled 2017-05-29: qty 0.3

## 2017-05-29 MED ORDER — INSULIN ASPART 100 UNIT/ML ~~LOC~~ SOLN
0.0000 [IU] | Freq: Three times a day (TID) | SUBCUTANEOUS | Status: DC
  Administered 2017-05-29: 3 [IU] via SUBCUTANEOUS
  Administered 2017-05-29: 8 [IU] via SUBCUTANEOUS
  Administered 2017-05-30: 15 [IU] via SUBCUTANEOUS
  Administered 2017-05-30: 11 [IU] via SUBCUTANEOUS
  Administered 2017-05-30 – 2017-05-31 (×3): 5 [IU] via SUBCUTANEOUS
  Administered 2017-05-31: 11 [IU] via SUBCUTANEOUS
  Administered 2017-06-01: 5 [IU] via SUBCUTANEOUS
  Administered 2017-06-01: 8 [IU] via SUBCUTANEOUS
  Filled 2017-05-29: qty 1

## 2017-05-29 MED ORDER — SODIUM CHLORIDE 0.9 % IV SOLN
INTRAVENOUS | Status: DC
  Administered 2017-05-29: 6.1 [IU]/h via INTRAVENOUS
  Filled 2017-05-29: qty 1

## 2017-05-29 MED ORDER — SODIUM CHLORIDE 0.9 % IV SOLN: INTRAVENOUS | Status: DC

## 2017-05-29 NOTE — ED Notes (Signed)
The npt is sleeping  His cbg has increased    Insulin drip increased to 5.2

## 2017-05-29 NOTE — ED Notes (Signed)
Patient calling out for leg cramp pain 10/10. Admitting paged

## 2017-05-29 NOTE — ED Notes (Signed)
Admitting paged and verbally ordered to hold fluids, calcium gluconate, kayexalate, and sodium bicarb until cmp  results.

## 2017-05-29 NOTE — ED Notes (Signed)
Pt c/o severe leg cramps  Dr Selena Batten  Contacted orders given

## 2017-05-29 NOTE — ED Notes (Signed)
Admitting at bedside, admitting made aware of leg cramps

## 2017-05-29 NOTE — ED Notes (Signed)
Patient given something to drink per admitting approval  

## 2017-05-29 NOTE — ED Notes (Signed)
Pt keeps asking for food and water

## 2017-05-29 NOTE — Progress Notes (Signed)
Inpatient Diabetes Program Recommendations  AACE/ADA: New Consensus Statement on Inpatient Glycemic Control (2015)  Target Ranges:  Prepandial:   less than 140 mg/dL      Peak postprandial:   less than 180 mg/dL (1-2 hours)      Critically ill patients:  140 - 180 mg/dL   Lab Results  Component Value Date   GLUCAP 169 (H) 05/29/2017    Review of Glycemic ControlResults for JAMEEK, BRUNTZ (MRN [id number]) as of 05/29/2017 15:04  Ref. Range 05/28/2017 16:03 05/29/2017 08:45  Glucose Latest Ref Range: 65 - 99 mg/dL 884 (HH) 148 (H)    Diabetes history: None Outpatient Diabetes medications: None Current orders for Inpatient glycemic control:  Novolog moderate tid with meals and HS, Levemir 15 units daily  Inpatient Diabetes Program Recommendations:    Note patient with new diagnosis of diabetes. He is currently being transferred to 5N.  Attempted to speak to him prior to transfer, however he was still very drowsy.  Will need education regarding new diagnosis of diabetes however not appropriate for education at this time.  Will order Living well with diabetes booklet, diabetes videos, insulin starter kit, etc.    Thanks, Adah Perl, RN, BC-ADM Inpatient Diabetes Coordinator Pager [phone] (8a-5p)

## 2017-05-29 NOTE — Progress Notes (Signed)
Patient ID: Jesse Weiss, male   DOB: 05-25-1966, 51 y.o.   MRN: [id number]  PROGRESS NOTE    Jesse Weiss  YQI:[id number] DOB: 21-Feb-1966 DOA: 05/28/2017 PCP: Patient, No Pcp Per   Brief Narrative:  51 year old male with no past medical history presented with fatigue along with polydipsia and polyuria. He was found to be in DKA and started on insulin drip.   Assessment & Plan:   Principal Problem:   DKA (diabetic ketoacidoses) ([place]) Active Problems:   Hyperkalemia   ARF (acute renal failure) (HCC)   Diabetes ([place])   DKA -Currently on insulin drip. Blood sugars improving. We will get stat BMP and check the anion gap. If anion gap has improved, we will start long-acting insulin and stop insulin drip 2 hours after the patient receives long-acting insulin. Currently on D5 half-normal saline - Start clear liquid diet and advance to carb modified diet if tolerated.  - Diabetes coordinator consult. Check hemoglobin A1c - Patient is unaware of diagnosis of diabetes. Patient needs to follow up with the primary care provider as a regular basis. Currently he does not have a primary care provider. Care management consult for the same.  Hyperkalemia - Check stat potassium level. If potassium level has improved, will hold off on calcium gluconate, sodium bicarbonate and Kayexalate - Check a.m. Labs  Probable pseudohyponatremia - Probably from hyperglycemia. Repeat stat sodium level   Acute kidney injury - Probably from DKA and dehydration. Repeat stat creatinine and a.m. labs   DVT prophylaxis: Lovenox Code Status:  Full Family Communication: None at bedside Disposition Plan: Home in 1-2 days if condition improves  Consultants: None  Procedures: None  Antimicrobials: None    Subjective: Patient seen and examined at bedside. He feels better and states that he is hungry. He is complaining of lower extremity cramps. No overnight fever or chest pain  Objective: Vitals:   05/29/17  1330 05/29/17 1415 05/29/17 1440 05/29/17 1510  BP: 125/85 136/83 136/83 130/82  Pulse: 74 77 76 79  Resp: 18 (!) 21 18   Temp:    98.2 F (36.8 C)  TempSrc:    Oral  SpO2: 95% 98% 97% 99%  Weight:      Height:        Intake/Output Summary (Last 24 hours) at 05/29/17 1529 Last data filed at 05/29/17 1500  Gross per 24 hour  Intake          2242.08 ml  Output             1650 ml  Net           592.08 ml   Filed Weights   05/28/17 1558  Weight: 84.4 kg (186 lb)    Examination:  General exam: Appears calm and comfortable  Respiratory system: Bilateral decreased breath sound at bases Cardiovascular system: S1 & S2 heard, Rate controlled  Gastrointestinal system: Abdomen is nondistended, soft and nontender. Normal bowel sounds heard. Central nervous system: Alert and oriented. No focal neurological deficits. Moving extremities Extremities: No cyanosis, clubbing, edema  Skin: No rashes, lesions or ulcers Lymph: No cervical lymphadenopathy    Data Reviewed: I have personally reviewed following labs and imaging studies  CBC:  Recent Labs Lab 05/28/17 1603  WBC 7.6  HGB 15.0  HCT 45.0  MCV 91.5  PLT 387   Basic Metabolic Panel:  Recent Labs Lab 05/28/17 1603 05/29/17 0845  NA 129* 141  K 5.9* 5.1  CL 96* 114*  CO2 11* 19*  GLUCOSE 884* 148*  BUN 23* 16  CREATININE 1.65* 1.08  CALCIUM 10.1 9.0  MG  --  2.3   GFR: Estimated Creatinine Clearance: 86.2 mL/min (by C-G formula based on SCr of 1.08 mg/dL). Liver Function Tests:  Recent Labs Lab 05/29/17 0845  AST 24  ALT 23  ALKPHOS 71  BILITOT 1.7*  PROT 6.6  ALBUMIN 3.5   No results for input(s): LIPASE, AMYLASE in the last 168 hours. No results for input(s): AMMONIA in the last 168 hours. Coagulation Profile: No results for input(s): INR, PROTIME in the last 168 hours. Cardiac Enzymes: No results for input(s): CKTOTAL, CKMB, CKMBINDEX, TROPONINI in the last 168 hours. BNP (last 3 results) No  results for input(s): PROBNP in the last 8760 hours. HbA1C: No results for input(s): HGBA1C in the last 72 hours. CBG:  Recent Labs Lab 05/29/17 0757 05/29/17 0905 05/29/17 1013 05/29/17 1127 05/29/17 1249  GLUCAP 182* 164* 118* 121* 169*   Lipid Profile: No results for input(s): CHOL, HDL, LDLCALC, TRIG, CHOLHDL, LDLDIRECT in the last 72 hours. Thyroid Function Tests: No results for input(s): TSH, T4TOTAL, FREET4, T3FREE, THYROIDAB in the last 72 hours. Anemia Panel: No results for input(s): VITAMINB12, FOLATE, FERRITIN, TIBC, IRON, RETICCTPCT in the last 72 hours. Sepsis Labs: No results for input(s): PROCALCITON, LATICACIDVEN in the last 168 hours.  No results found for this or any previous visit (from the past 240 hour(s)).       Radiology Studies: Dg Chest 2 View  Result Date: 05/28/2017 CLINICAL DATA:  Dyspnea and cough. Fatigue, nausea and increased thirst x1 week. EXAM: CHEST  2 VIEW COMPARISON:  08/22/2014 FINDINGS: The heart size and mediastinal contours are within normal limits. Both lungs are clear. The visualized skeletal structures are unremarkable. IMPRESSION: No active cardiopulmonary disease. Electronically Signed   By: Ashley Royalty M.D.   On: 05/28/2017 19:36   US Renal  Result Date: 05/28/2017 CLINICAL DATA:  Acute renal failure and intermittent abdominal pain x2 weeks. EXAM: RENAL / URINARY TRACT ULTRASOUND COMPLETE COMPARISON:  None. FINDINGS: Right Kidney: Length: 11.1 cm. Echogenicity within normal limits. No mass or hydronephrosis visualized. Left Kidney: Length: 10.8 cm. Echogenicity within normal limits. No mass or hydronephrosis visualized. Bladder: Appears normal for degree of bladder distention. IMPRESSION: No obstructive uropathy. Maintained cortical-medullary distinction within both kidneys. No sonographic findings for the patient's renal failure are identified. Electronically Signed   By: Ashley Royalty M.D.   On: 05/28/2017 22:02         Scheduled Meds: . insulin aspart  0-15 Units Subcutaneous TID WC  . insulin aspart  0-5 Units Subcutaneous QHS  . insulin detemir  15 Units Subcutaneous Daily  . insulin starter kit- syringes  1 kit Other Once  . living well with diabetes book   Does not apply Once   Continuous Infusions: . sodium chloride 75 mL/hr at 05/29/17 1230  . insulin (NOVOLIN-R) infusion Stopped (05/29/17 1228)     LOS: 1 day        Aline August, MD [facility] Pager [phone]  If 7PM-7AM, please contact night-coverage [email or web address] Password [facility] 05/29/2017, 3:29 PM

## 2017-05-29 NOTE — ED Notes (Signed)
Verbally ordered by [facility] (admitting) to stop dextrose 5% wih insulin and begin ns at 55ml/hr

## 2017-05-29 NOTE — Progress Notes (Signed)
RN notified pharmacy to send living well book and insulin starter kit for patient education.

## 2017-05-29 NOTE — ED Notes (Signed)
Patient tolerating oral fluids at this time.

## 2017-05-29 NOTE — ED Notes (Signed)
NS started at 36ml/hr per admitting MD

## 2017-05-29 NOTE — ED Notes (Signed)
Attempted report 

## 2017-05-30 LAB — BASIC METABOLIC PANEL
ANION GAP: 7 (ref 5–15)
BUN: 11 mg/dL (ref 6–20)
CALCIUM: 8.4 mg/dL — AB (ref 8.9–10.3)
CHLORIDE: 103 mmol/L (ref 101–111)
CO2: 22 mmol/L (ref 22–32)
Creatinine, Ser: 1.01 mg/dL (ref 0.61–1.24)
GFR calc Af Amer: >60 mL/min (ref >60)
GFR calc non Af Amer: >60 mL/min (ref >60)
Glucose, Bld: 426 mg/dL — ABNORMAL HIGH (ref 65–99)
Potassium: 3.4 mmol/L — ABNORMAL LOW (ref 3.5–5.1)
Sodium: 132 mmol/L — ABNORMAL LOW (ref 135–145)

## 2017-05-30 LAB — GLUCOSE, CAPILLARY
GLUCOSE-CAPILLARY: 487 mg/dL — AB (ref 65–99)
GLUCOSE-CAPILLARY: 344 mg/dL — AB (ref 65–99)
Glucose-Capillary: 394 mg/dL — ABNORMAL HIGH (ref 65–99)
Glucose-Capillary: 226 mg/dL — ABNORMAL HIGH (ref 65–99)
Glucose-Capillary: 210 mg/dL — ABNORMAL HIGH (ref 65–99)

## 2017-05-30 LAB — CBC WITH DIFFERENTIAL/PLATELET
BASOS PCT: 0 %
Basophils Absolute: 0 10*3/uL (ref 0.0–0.1)
EOS ABS: 0.1 10*3/uL (ref 0.0–0.7)
EOS PCT: 1 %
HCT: 42.5 % (ref 39.0–52.0)
HEMOGLOBIN: 14.3 g/dL (ref 13.0–17.0)
LYMPHS PCT: 30 %
Lymphs Abs: 2.1 10*3/uL (ref 0.7–4.0)
MCH: 29.8 pg (ref 26.0–34.0)
MCHC: 33.6 g/dL (ref 30.0–36.0)
MCV: 88.5 fL (ref 78.0–100.0)
Monocytes Absolute: 0.3 10*3/uL (ref 0.1–1.0)
Monocytes Relative: 4 %
Neutro Abs: 4.6 10*3/uL (ref 1.7–7.7)
Neutrophils Relative %: 65 %
Platelets: 242 10*3/uL (ref 150–400)
RBC: 4.8 MIL/uL (ref 4.22–5.81)
RDW: 13.1 % (ref 11.5–15.5)
WBC: 7.1 10*3/uL (ref 4.0–10.5)

## 2017-05-30 MED ORDER — INSULIN DETEMIR 100 UNIT/ML ~~LOC~~ SOLN
25.0000 [IU] | Freq: Every day | SUBCUTANEOUS | Status: DC
  Administered 2017-05-30 – 2017-05-31 (×2): 25 [IU] via SUBCUTANEOUS
  Filled 2017-05-30 (×2): qty 0.25

## 2017-05-30 MED ORDER — INSULIN ASPART 100 UNIT/ML ~~LOC~~ SOLN
5.0000 [IU] | Freq: Three times a day (TID) | SUBCUTANEOUS | Status: DC
  Administered 2017-05-30 (×2): 5 [IU] via SUBCUTANEOUS

## 2017-05-30 MED ORDER — POTASSIUM CHLORIDE CRYS ER 20 MEQ PO TBCR
40.0000 meq | EXTENDED_RELEASE_TABLET | Freq: Once | ORAL | Status: AC
  Administered 2017-05-30: 40 meq via ORAL
  Filled 2017-05-30: qty 2

## 2017-05-30 MED ORDER — INSULIN ASPART 100 UNIT/ML ~~LOC~~ SOLN
7.0000 [IU] | Freq: Three times a day (TID) | SUBCUTANEOUS | Status: DC
  Administered 2017-05-30 – 2017-05-31 (×2): 7 [IU] via SUBCUTANEOUS

## 2017-05-30 NOTE — Clinical Social Work Note (Signed)
Clinical Social Work Assessment  Patient Details  Name: Jesse Weiss MRN: [id number] Date of Birth: 03/15/1966  Date of referral:  05/30/17               Reason for consult:  Other (Comment Required) (No insurance)                Permission sought to share information with:    Permission granted to share information::  Yes, Verbal Permission Granted  Name::        Agency::  Financial Counseling  Relationship::     Contact Information:     Housing/Transportation Living arrangements for the past 2 months:  Single Family Home Source of Information:  Patient, Medical Team Patient Interpreter Needed:  None Criminal Activity/Legal Involvement Pertinent to Current Situation/Hospitalization:  No - Comment as needed Significant Relationships:  Friend Lives with:    Do you feel safe going back to the place where you live?  Yes Need for family participation in patient care:  Yes (Comment)  Care giving concerns:  No insurance.   Social Worker assessment / plan:  CSW received notification that patient had questions regarding Medicaid. CSW met with patient. No supports at bedside. CSW introduced role and inquired about questions regarding Medicaid. Patient confirmed and said he wanted to sign up. CSW told patient CSW would make a call to financial counseling but unsure if they work on the weekends. If they are unable to see him over the weekend and patient discharges before they are able to, patient advised to go to DSS to apply. Patient expressed understanding. CSW left voicemail for financial counselor. No further concerns. CSW signing off as social work intervention no longer needed.  Employment status:  Unemployed Insurance information:  Self Pay (Medicaid Pending) PT Recommendations:  Not assessed at this time Information / Referral to community resources:  Other (Comment Required) (Medicaid)  Patient/Family's Response to care:  Patient agreeable to information. Patient's supports unknown.  Patient appreciated social work intervention.  Patient/Family's Understanding of and Emotional Response to Diagnosis, Current Treatment, and Prognosis:  Patient has a good understanding of the reason for admission and social work consult. Patient appears happy with hospital care.  Emotional Assessment Appearance:  Appears stated age Attitude/Demeanor/Rapport:  Other (Pleasant) Affect (typically observed):  Accepting, Appropriate, Calm, Pleasant Orientation:  Oriented to Self, Oriented to Place, Oriented to  Time, Oriented to Situation Alcohol / Substance use:  Never Used Psych involvement (Current and /or in the community):  No (Comment)  Discharge Needs  Concerns to be addressed:  Financial / Insurance Concerns Readmission within the last 30 days:  No Current discharge risk:  None Barriers to Discharge:  No Barriers Identified   Sarah C Boswell, LCSW 05/30/2017, 1:05 PM  

## 2017-05-30 NOTE — Progress Notes (Signed)
Inpatient Diabetes Program Recommendations  AACE/ADA: New Consensus Statement on Inpatient Glycemic Control (2015)  Target Ranges:  Prepandial:   less than 140 mg/dL      Peak postprandial:   less than 180 mg/dL (1-2 hours)      Critically ill patients:  140 - 180 mg/dL   Results for Jesse Weiss, Jesse Weiss (MRN [id number]) as of 05/30/2017 16:32  Ref. Range 05/28/2017 16:02 05/28/2017 20:04 05/28/2017 22:27 05/29/2017 00:18 05/29/2017 01:28 05/29/2017 02:35 05/29/2017 03:43 05/29/2017 05:04 05/29/2017 06:44 05/29/2017 07:57 05/29/2017 09:05 05/29/2017 10:13 05/29/2017 11:27 05/29/2017 12:49 05/29/2017 16:56 05/29/2017 20:25  Glucose-Capillary Latest Ref Range: 65 - 99 mg/dL >409 (HH)  IV Insulin Drip 507 (HH)  IV Insulin Drip 417 (H)  IV Insulin Drip 339 (H)  IV Insulin Drip 339 (H)  IV Insulin Drip 224 (H)  IV Insulin Drip 185 (H)  IV Insulin Drip 168 (H)  IV Insulin Drip 190 (H)  IV Insulin Drip 182 (H)  IV Insulin Drip 164 (H)  IV Insulin Drip 118 (H)  IV Insulin Drip 121 (H)  15 units Levemir 169 (H)  3 units Novolog 277 (H)  8 units Novolog 195 (H)   Results for Jesse Weiss, Jesse Weiss (MRN [id number]) as of 05/30/2017 16:32  Ref. Range 05/30/2017 06:30 05/30/2017 12:44  Glucose-Capillary Latest Ref Range: 65 - 99 mg/dL 956 (H)  15 units Novolog 344 (H)  16 units Novolog +   25 units Levemir    Admit with: DKA/ New Diagnosis of DM  Current Insulin Orders: Levemir 25 units daily      Novolog Moderate Correction Scale/ SSI (0-15 units) TID AC + HS      Novolog 7 units TID with meals      MD- Will have nursing staff begin basic Diabetes education with patient and also begin insulin teaching for this patient as it looks as if he will likely require insulin for home.  The Diabetes Coordinator is not physically present on campus over the weekend but is available for consultation by phone if needed.  Note that Hemoglobin A1c still in process.  Will likely not get results until Monday AM.  Note  that CBGs remain very high today.  Note also that Levemir and Novolog insulins both increased today.    MD- Note that patient does not have health insurance coverage.  Will likely not be able to afford Levemir and Novolog at time of discharge.  May consider conversion to 70/30 Insulin for affordability.  Patient can purchase Reli-On brand 70/30 Insulin at [facility] for $25 per vial.  Not sure how much 70/30 Insulin to recommend yet since Levemir dose just increased this AM.  Will re-evaluate pt's CBGs in the AM and leave recommendations for conversion to 70/30 Insulin if desired.  70/30 Insulin may be an easier insulin for patient as it only needs to be taken BID.     --Will follow patient during hospitalization--  Ambrose Finland RN, MSN, CDE Diabetes Coordinator Inpatient Glycemic Control Team Team Pager: [phone] (8a-5p)

## 2017-05-30 NOTE — Progress Notes (Signed)
Patient ID: Jesse Weiss, male   DOB: [birth year], 51 y.o.   MRN: [id number]  PROGRESS NOTE    Korrey Schleicher  WUJ:[id number] DOB: 05/04/1966 DOA: 05/28/2017 PCP: Patient, No Pcp Per   Brief Narrative:  51 year old male with no past medical history presented with fatigue along with polydipsia and polyuria. He was found to be in DKA and started on insulin drip. Anion gap closed and insulin drip was transitioned to long-acting insulin.   Assessment & Plan:   Principal Problem:   DKA (diabetic ketoacidoses) (HCC) Active Problems:   Hyperkalemia   ARF (acute renal failure) (HCC)   Diabetes (HCC)   DKA -resolved. Off insulin drip. Long-acting insulin has already been started. Blood sugars remain high. Increase Levemir to 25 units subcutaneous daily. Add NovoLog 5 units subcutaneous 3 times a day with meals. Follow hemoglobin A1c. Comply with diet. Follow diabetes coordinator recommendations - DC IV fluids - Patient is unaware of diagnosis of diabetes. Patient needs to follow up with the primary care provider at a regular basis. Currently he does not have a primary care provider. Care management consult for the same.  Hyperkalemia - resolved. Potassium level on the lower side today, we will replace  Probable pseudohyponatremia - Probably from hyperglycemia. Resolved  Acute kidney injury - Probably from DKA and dehydration. Resolved  DVT prophylaxis: Lovenox Code Status:  Full Family Communication: spoke to patient and cousin at bedside Disposition Plan: Home tomorrow if condition improves.  Consultants: None  Procedures: None  Antimicrobials: None   Subjective: Patient seen and examined at bedside. He feels much better but still feels very weak. No overnight fever nausea or vomiting.  Objective: Vitals:   05/30/17 0004 05/30/17 0421 05/30/17 0820 05/30/17 1220  BP: (!) 133/91 128/84 117/74 (!) 135/94  Pulse: 76 73 (!) 101 79  Resp:  Temp: 97.6 F (36.4 C)  98.4 F (36.9 C) 97.7 F (36.5 C) (!) 97.4 F (36.3 C)  TempSrc: Oral Oral Oral Oral  SpO2: 99% 100% 100% 100%  Weight:      Height:        Intake/Output Summary (Last 24 hours) at 05/30/17 1403 Last data filed at 05/30/17 1112  Gross per 24 hour  Intake           782.08 ml  Output              900 ml  Net          -117.92 ml   Filed Weights   05/28/17 1558  Weight: 84.4 kg (186 lb)    Examination:  General exam: Appears calm and comfortable  Respiratory system: Bilateral decreased breath sound at bases Cardiovascular system: S1 & S2 heard, rate controlled.  Gastrointestinal system: Abdomen is nondistended, soft and nontender. Normal bowel sounds heard. Extremities: No cyanosis, clubbing, edema    Data Reviewed: I have personally reviewed following labs and imaging studies  CBC:  Recent Labs Lab 05/28/17 1603 05/30/17 0641  WBC 7.6 7.1  NEUTROABS  --  4.6  HGB 15.0 14.3  HCT 45.0 42.5  MCV 91.5 88.5  PLT 250 242   Basic Metabolic Panel:  Recent Labs Lab 05/28/17 1603 05/29/17 0845 05/29/17 1508 05/30/17 0641  NA 129* 141 135 132*  K 5.9* 5.1 3.8 3.4*  CL 96* 114* 107 103  CO2 11* 19* 21* 22  GLUCOSE 884* 148* 360* 426*  BUN 23* CREATININE 1.65* 1.08 1.17 1.01  CALCIUM 10.1  9.0 8.5* 8.4*  MG  --  2.3  --   --    GFR: Estimated Creatinine Clearance: 92.2 mL/min (by C-G formula based on SCr of 1.01 mg/dL). Liver Function Tests:  Recent Labs Lab 05/29/17 0845  AST 24  ALT 23  ALKPHOS 71  BILITOT 1.7*  PROT 6.6  ALBUMIN 3.5   No results for input(s): LIPASE, AMYLASE in the last 168 hours. No results for input(s): AMMONIA in the last 168 hours. Coagulation Profile: No results for input(s): INR, PROTIME in the last 168 hours. Cardiac Enzymes: No results for input(s): CKTOTAL, CKMB, CKMBINDEX, TROPONINI in the last 168 hours. BNP (last 3 results) No results for input(s): PROBNP in the last 8760 hours. HbA1C: No results for  input(s): HGBA1C in the last 72 hours. CBG:  Recent Labs Lab 05/29/17 1656 05/29/17 2025 05/30/17 0425 05/30/17 0630 05/30/17 1244  GLUCAP 277* 195* 487* 394* 344*   Lipid Profile: No results for input(s): CHOL, HDL, LDLCALC, TRIG, CHOLHDL, LDLDIRECT in the last 72 hours. Thyroid Function Tests: No results for input(s): TSH, T4TOTAL, FREET4, T3FREE, THYROIDAB in the last 72 hours. Anemia Panel: No results for input(s): VITAMINB12, FOLATE, FERRITIN, TIBC, IRON, RETICCTPCT in the last 72 hours. Sepsis Labs: No results for input(s): PROCALCITON, LATICACIDVEN in the last 168 hours.  No results found for this or any previous visit (from the past 240 hour(s)).       Radiology Studies: Dg Chest 2 View  Result Date: 05/28/2017 CLINICAL DATA:  Dyspnea and cough. Fatigue, nausea and increased thirst x1 week. EXAM: CHEST  2 VIEW COMPARISON:  08/22/2014 FINDINGS: The heart size and mediastinal contours are within normal limits. Both lungs are clear. The visualized skeletal structures are unremarkable. IMPRESSION: No active cardiopulmonary disease. Electronically Signed   By: Tollie Eth M.D.   On: 05/28/2017 19:36   US Renal  Result Date: 05/28/2017 CLINICAL DATA:  Acute renal failure and intermittent abdominal pain x2 weeks. EXAM: RENAL / URINARY TRACT ULTRASOUND COMPLETE COMPARISON:  None. FINDINGS: Right Kidney: Length: 11.1 cm. Echogenicity within normal limits. No mass or hydronephrosis visualized. Left Kidney: Length: 10.8 cm. Echogenicity within normal limits. No mass or hydronephrosis visualized. Bladder: Appears normal for degree of bladder distention. IMPRESSION: No obstructive uropathy. Maintained cortical-medullary distinction within both kidneys. No sonographic findings for the patient's renal failure are identified. Electronically Signed   By: Tollie Eth M.D.   On: 05/28/2017 22:02        Scheduled Meds: . insulin aspart  0-15 Units Subcutaneous TID WC  . insulin aspart   0-5 Units Subcutaneous QHS  . insulin aspart  5 Units Subcutaneous TID WC  . insulin detemir  25 Units Subcutaneous Daily   Continuous Infusions: . sodium chloride 75 mL/hr at 05/29/17 1230     LOS: 2 days        Glade Lloyd, MD [facility] Pager [phone]  If 7PM-7AM, please contact night-coverage [email or web address] Password [email or web address] 05/30/2017, 2:03 PM

## 2017-05-30 NOTE — Progress Notes (Signed)
FSBS yesterday evening was 195. During the night patient was hungry and had (what was available upon the unit at the time) 4 graham crackers, 1 applesauce and 1 ginger ale, that RN is aware of. Pt still requesting food at 0400 hour. Requesting chips; says he has his own money and wants to go buy some. RN concerned of potential FSBS elevation and explains to patient that the types of foods he is requesting needs to be in moderation to not raise FSBS. Pt states, "I know I just really want some chips and that's all I will eat." Checked FSBS at this time; result in 400's (see lab flowsheet). Explained to patient inability to allow him to get chips. Encouraged pt to drink more water at this time. Pt understood. Will recheck FSBS at 0600 hour. No adverse reactions noted by patient.

## 2017-05-31 LAB — CBC WITH DIFFERENTIAL/PLATELET
BASOS PCT: 1 %
Basophils Absolute: 0.1 10*3/uL (ref 0.0–0.1)
EOS ABS: 0.1 10*3/uL (ref 0.0–0.7)
EOS PCT: 1 %
HCT: 38.8 % — ABNORMAL LOW (ref 39.0–52.0)
HEMOGLOBIN: 13.2 g/dL (ref 13.0–17.0)
LYMPHS PCT: 42 %
Lymphs Abs: 2.6 10*3/uL (ref 0.7–4.0)
MCH: 30 pg (ref 26.0–34.0)
MCHC: 34 g/dL (ref 30.0–36.0)
MCV: 88.2 fL (ref 78.0–100.0)
Monocytes Absolute: 0.4 10*3/uL (ref 0.1–1.0)
Monocytes Relative: 7 %
NEUTROS ABS: 2.9 10*3/uL (ref 1.7–7.7)
Neutrophils Relative %: 49 %
Platelets: 225 10*3/uL (ref 150–400)
RBC: 4.4 MIL/uL (ref 4.22–5.81)
RDW: 12.8 % (ref 11.5–15.5)
WBC: 6.1 10*3/uL (ref 4.0–10.5)

## 2017-05-31 LAB — GLUCOSE, CAPILLARY
GLUCOSE-CAPILLARY: 255 mg/dL — AB (ref 65–99)
Glucose-Capillary: 229 mg/dL — ABNORMAL HIGH (ref 65–99)
Glucose-Capillary: 346 mg/dL — ABNORMAL HIGH (ref 65–99)
Glucose-Capillary: 223 mg/dL — ABNORMAL HIGH (ref 65–99)

## 2017-05-31 LAB — BASIC METABOLIC PANEL
ANION GAP: 5 (ref 5–15)
BUN: 6 mg/dL (ref 6–20)
CALCIUM: 8.4 mg/dL — AB (ref 8.9–10.3)
CO2: 26 mmol/L (ref 22–32)
CREATININE: 0.74 mg/dL (ref 0.61–1.24)
Chloride: 104 mmol/L (ref 101–111)
GFR calc Af Amer: >60 mL/min (ref >60)
GFR calc non Af Amer: >60 mL/min (ref >60)
GLUCOSE: 212 mg/dL — AB (ref 65–99)
Potassium: 3.3 mmol/L — ABNORMAL LOW (ref 3.5–5.1)
Sodium: 135 mmol/L (ref 135–145)

## 2017-05-31 LAB — MAGNESIUM: MAGNESIUM: 1.8 mg/dL (ref 1.7–2.4)

## 2017-05-31 MED ORDER — POTASSIUM CHLORIDE CRYS ER 20 MEQ PO TBCR
60.0000 meq | EXTENDED_RELEASE_TABLET | Freq: Once | ORAL | Status: AC
  Administered 2017-05-31: 60 meq via ORAL
  Filled 2017-05-31: qty 3

## 2017-05-31 MED ORDER — INSULIN ASPART PROT & ASPART (70-30 MIX) 100 UNIT/ML ~~LOC~~ SUSP
22.0000 [IU] | Freq: Two times a day (BID) | SUBCUTANEOUS | Status: DC
  Administered 2017-05-31 – 2017-06-01 (×2): 22 [IU] via SUBCUTANEOUS
  Filled 2017-05-31: qty 10

## 2017-05-31 NOTE — Progress Notes (Signed)
Patient ID: Negan Grudzien, male   DOB: [birth year], 51 y.o.   MRN: [id number]  PROGRESS NOTE    Tyberius Ryner  [id number]:[id number] DOB: May 19, 1966 DOA: 05/28/2017 PCP: Patient, No Pcp Per   Brief Narrative:  51 year old male with no past medical history presented with fatigue along with polydipsia and polyuria. He was found to be in DKA and started on insulin drip. Anion gap closed and insulin drip was transitioned to long-acting insulin.   Assessment & Plan:   Principal Problem:   DKA (diabetic ketoacidoses) (HCC) Active Problems:   Hyperkalemia   ARF (acute renal failure) (HCC)   Diabetes (HCC)   DKA -resolved. Off insulin drip. Currently on Levemir. Switch to 70/30 insulin as per diabetes coordinator recommendation. Monitor blood sugars. Probable discharge tomorrow Long-acting insulin has already been started. - Follow hemoglobin A1c. Comply with diet.  - Patient is unaware of diagnosis of diabetes. Patient needs to follow up with the primary care provider at a regular basis. Currently he does not have a primary care provider. Care management consult for the same.  Hyperkalemia - resolved. Potassium level on the lower side today, we will replace  Probable pseudohyponatremia - Probably from hyperglycemia. Resolved  Acute kidney injury - Probably from DKA and dehydration. Resolved  DVT prophylaxis:Lovenox Code Status:Full Family Communication:None at bedside Disposition Plan:Home tomorrow if condition remains stable  Consultants:None  Procedures:None  Antimicrobials: None   Subjective: Patient seen and examined at bedside. He denies any current nausea, vomiting or fever. He feels weak.  Objective: Vitals:   05/30/17 1900 05/31/17 0300 05/31/17 0301 05/31/17 0747  BP: (!) 130/99 (!) 138/9 (!) 138/99 134/90  Pulse: 82 78  77  Resp: Temp: (!) 97.5 F (36.4 C) 98.1 F (36.7 C)  98.1 F (36.7 C)  TempSrc: Oral Oral  Oral  SpO2: 100% 100%  100%    Weight:      Height:       No intake or output data in the 24 hours ending 05/31/17 1132 Filed Weights   05/28/17 1558  Weight: 84.4 kg (186 lb)    Examination:  General exam: Appears calm and comfortable  Respiratory system: Bilateral decreased breath sound at bases Cardiovascular system: S1 & S2 heard, Rate controlled  Gastrointestinal system: Abdomen is nondistended, soft and nontender. Normal bowel sounds heard. Extremities: No cyanosis, clubbing, edema    Data Reviewed: I have personally reviewed following labs and imaging studies  CBC:  Recent Labs Lab 05/28/17 1603 05/30/17 0641 05/31/17 0432  WBC 7.6 7.1 6.1  NEUTROABS  --  4.6 2.9  HGB 15.0 14.3 13.2  HCT 45.0 42.5 38.8*  MCV 91.5 88.5 88.2  PLT 250 242 225   Basic Metabolic Panel:  Recent Labs Lab 05/28/17 1603 05/29/17 0845 05/29/17 1508 05/30/17 0641 05/31/17 0432  NA 129* 141 135 132* 135  K 5.9* 5.1 3.8 3.4* 3.3*  CL 96* 114* 107 103 104  CO2 11* 19* 21* 22 26  GLUCOSE 884* 148* 360* 426* 212*  BUN 23* CREATININE 1.65* 1.08 1.17 1.01 0.74  CALCIUM 10.1 9.0 8.5* 8.4* 8.4*  MG  --  2.3  --   --  1.8   GFR: Estimated Creatinine Clearance: 116.3 mL/min (by C-G formula based on SCr of 0.74 mg/dL). Liver Function Tests:  Recent Labs Lab 05/29/17 0845  AST 24  ALT 23  ALKPHOS 71  BILITOT 1.7*  PROT 6.6  ALBUMIN 3.5  No results for input(s): LIPASE, AMYLASE in the last 168 hours. No results for input(s): AMMONIA in the last 168 hours. Coagulation Profile: No results for input(s): INR, PROTIME in the last 168 hours. Cardiac Enzymes: No results for input(s): CKTOTAL, CKMB, CKMBINDEX, TROPONINI in the last 168 hours. BNP (last 3 results) No results for input(s): PROBNP in the last 8760 hours. HbA1C: No results for input(s): HGBA1C in the last 72 hours. CBG:  Recent Labs Lab 05/30/17 0630 05/30/17 1244 05/30/17 1654 05/30/17 2007 05/31/17 0426  GLUCAP 394* 344*  226* 210* 229*   Lipid Profile: No results for input(s): CHOL, HDL, LDLCALC, TRIG, CHOLHDL, LDLDIRECT in the last 72 hours. Thyroid Function Tests: No results for input(s): TSH, T4TOTAL, FREET4, T3FREE, THYROIDAB in the last 72 hours. Anemia Panel: No results for input(s): VITAMINB12, FOLATE, FERRITIN, TIBC, IRON, RETICCTPCT in the last 72 hours. Sepsis Labs: No results for input(s): PROCALCITON, LATICACIDVEN in the last 168 hours.  No results found for this or any previous visit (from the past 240 hour(s)).       Radiology Studies: No results found.      Scheduled Meds: . insulin aspart  0-15 Units Subcutaneous TID WC  . insulin aspart  0-5 Units Subcutaneous QHS  . insulin aspart protamine- aspart  22 Units Subcutaneous BID WC   Continuous Infusions:   LOS: 3 days        Glade Lloyd, MD [facility] Pager [phone]  If 7PM-7AM, please contact night-coverage [email or web address] Password [email or web address] 05/31/2017, 11:32 AM

## 2017-05-31 NOTE — Progress Notes (Addendum)
Inpatient Diabetes Program Recommendations  AACE/ADA: New Consensus Statement on Inpatient Glycemic Control (2015)  Target Ranges:  Prepandial:   less than 140 mg/dL      Peak postprandial:   less than 180 mg/dL (1-2 hours)      Critically ill patients:  140 - 180 mg/dL   Results for KIMMIE, DOREN (MRN [id number]) as of 05/31/2017 09:04  Ref. Range 05/30/2017 06:30 05/30/2017 12:44 05/30/2017 16:54 05/30/2017 20:07  Glucose-Capillary Latest Ref Range: 65 - 99 mg/dL 409 (H)  20 units Novolog 344 (H)  16 units Novolog  +  25 units Levemir 226 (H)  12 units Novolog  210 (H)  2 units Novolog   Results for KANAN, SOBEK (MRN [id number]) as of 05/31/2017 09:04  Ref. Range 05/31/2017 04:26  Glucose-Capillary Latest Ref Range: 65 - 99 mg/dL 956 (H)  12 units Novolog +    25 units Levemir    Admit with: DKA/ New Diagnosis of DM  Current Insulin Orders: Levemir 25 units daily                                       Novolog Moderate Correction Scale/ SSI (0-15 units) TID AC + HS                                       Novolog 7 units TID with meals      MD- Will have nursing staff begin basic Diabetes education with patient and also begin insulin teaching for this patient as it looks as if he will likely require insulin for home.  The Diabetes Coordinator is not physically present on campus over the weekend but is available for consultation by phone if needed.  Note that Hemoglobin A1c still in process.  Will likely not get results until Monday AM.  Note that Levemir and Novolog insulins both increased yesterday.      MD- Note that patient does not have health insurance coverage.  Will likely not be able to afford Levemir and Novolog at time of discharge.  May consider conversion to 70/30 Insulin for affordability.  Patient can purchase Reli-On brand 70/30 Insulin at [facility] for $25 per vial.  70/30 Insulin may be an easier insulin for patient as it only needs to be taken  BID.   Please consider converting patient to 70/30 Insulin today so we can see how much insulin he will need at home.  Would delay discharge until Monday if possible so we can get an idea about how much insulin he will need and also to allow the nursing staff to complete all basic diabetes survival skills education for this patient prior to discharge.   Could start with 70/30 Insulin 22 units BID with meals    This dose would give patient approximately 30 units longer-acting insulin throughout the day (he would get 15 units in the AM and 15 units in the PM).  This would be about 0.35 units/kg dosing for the basal insulin portion.  This dose would also give patient approximately 6.6 units rapid-acting insulin with breakfast and with dinner.    --Will follow patient during hospitalization--  Ambrose Finland RN, MSN, CDE Diabetes Coordinator Inpatient Glycemic Control Team Team Pager: [phone] (8a-5p)

## 2017-06-01 LAB — MAGNESIUM: MAGNESIUM: 2 mg/dL (ref 1.7–2.4)

## 2017-06-01 LAB — HEMOGLOBIN A1C
Hgb A1c MFr Bld: >15.5 % — ABNORMAL HIGH (ref 4.8–5.6)
Mean Plasma Glucose: >398 mg/dL

## 2017-06-01 LAB — BASIC METABOLIC PANEL
Anion gap: 5 (ref 5–15)
BUN: 7 mg/dL (ref 6–20)
CHLORIDE: 105 mmol/L (ref 101–111)
CO2: 28 mmol/L (ref 22–32)
CREATININE: 0.77 mg/dL (ref 0.61–1.24)
Calcium: 8.8 mg/dL — ABNORMAL LOW (ref 8.9–10.3)
Glucose, Bld: 264 mg/dL — ABNORMAL HIGH (ref 65–99)
POTASSIUM: 3.5 mmol/L (ref 3.5–5.1)
SODIUM: 138 mmol/L (ref 135–145)

## 2017-06-01 LAB — GLUCOSE, CAPILLARY
GLUCOSE-CAPILLARY: 260 mg/dL — AB (ref 65–99)
Glucose-Capillary: 202 mg/dL — ABNORMAL HIGH (ref 65–99)

## 2017-06-01 MED ORDER — "INSULIN SYRINGE 31G X 5/16"" 0.3 ML MISC": 0 refills | Status: DC

## 2017-06-01 MED ORDER — INSULIN NPH ISOPHANE & REGULAR (70-30) 100 UNIT/ML ~~LOC~~ SUSP: 27.0000 [IU] | Freq: Two times a day (BID) | SUBCUTANEOUS | 1 refills | Status: DC

## 2017-06-01 MED ORDER — BLOOD GLUCOSE METER KIT: PACK | 0 refills | Status: DC

## 2017-06-01 MED FILL — TRUEplus LANCETS 28G MISC: 25 days supply | Qty: 100 | Fill #0

## 2017-06-01 MED FILL — NOVOLIN 70/30 100 UNITS/ML: (70-30) 100 | 19 days supply | Qty: 10 | Fill #0

## 2017-06-01 MED FILL — TRUEPLUS SYR 0.3ML 31GX5/16: 31G X 5/16" | 30 days supply | Qty: 100 | Fill #0

## 2017-06-01 MED FILL — !TRUE METRIX BLOOD GLUCOSE: 30 days supply | Qty: 1 | Fill #0

## 2017-06-01 MED FILL — TRUE METRIX TEST STRIP: 25 days supply | Qty: 100 | Fill #0

## 2017-06-01 NOTE — Plan of Care (Signed)
Problem: Food- and Nutrition-Related Knowledge Deficit (NB-1.1) Goal: Nutrition education Formal process to instruct or train a patient/client in a skill or to impart knowledge to help patients/clients voluntarily manage or modify food choices and eating behavior to maintain or improve health. Outcome: Completed/Met Date Met: 06/01/17  RD consulted for nutrition education regarding diabetes.   Lab Results  Component Value Date   HGBA1C >15.5 (H) 05/29/2017    RD provided "Carbohydrate Counting for People with Diabetes" handout from the Academy of [employer] and Dietetics. Discussed different food groups and their effects on blood sugar, emphasizing carbohydrate-containing foods. Provided list of carbohydrates and recommended serving sizes of common foods.  Discussed importance of controlled and consistent carbohydrate intake throughout the day. Provided examples of ways to balance meals/snacks and encouraged intake of high-fiber, whole grain complex carbohydrates. Discussed diabetic friendly drink options.Teach back method used.  Expect good compliance.  Body mass index is 25.94 kg/m. Pt meets criteria for overweight based on current BMI.  Current diet order is carbohydrate modified, patient is consuming approximately 75% of meals at this time. Labs and medications reviewed. No further nutrition interventions warranted at this time. RD contact information provided. If additional nutrition issues arise, please re-consult RD. Plans for discharge today.  Corrin Parker, MS, RD, LDN Pager # [phone] After hours/ weekend pager # [phone]

## 2017-06-01 NOTE — Care Management Note (Signed)
Case Management Note  Patient Details  Name: Jesse Weiss MRN: [id number] Date of Birth: Jun 30, 1966  Subjective/Objective:      51 yr old gentleman admitted with fatigue along with polydipsia and polyuriae, found to be new Diabetic.               Action/Plan: Case manager has scheduled patient to follow up with [facility], appointment is for Thursday, June 11, 2017 at 3:00pm.  Case manager will  Provide patient with [facility] letter to take to [facility] for his insulin.    Expected Discharge Date:  06/01/17               Expected Discharge Plan:  Home/Self Care  In-House Referral:  NA  Discharge planning Services  CM Consult, [facility], [facility]  Post Acute Care Choice:  NA Choice offered to:  NA  DME Arranged:  N/A DME Agency:  NA  HH Arranged:  NA HH Agency:  NA  Status of Service:  Completed, signed off  If discussed at Long Length of Stay Meetings, dates discussed:    Additional Comments:  Durenda Guthrie, RN 06/01/2017, 10:43 AM

## 2017-06-01 NOTE — Discharge Summary (Signed)
Physician Discharge Summary  Jesse Weiss GYF:[id number] DOB: Aug 17, 1966 DOA: 05/28/2017  PCP: Patient, No Pcp Per  Admit date: 05/28/2017 Discharge date: 06/01/2017  Admitted From: Home Disposition:  home  Recommendations for Outpatient Follow-up:  1. Follow up with PCP in 1week with repeat CBC/BMP 2. Patient might need outpatient endocrinology evaluation and/or follow-up in diabetes clinic 3. Comply with medications and follow-up   Home Health: no  Equipment/Devices: none  Discharge Condition: stable  CODE STATUS: full  Diet recommendation: Heart Healthy / Carb Modified   Brief/Interim Summary: 51 year old male with no past medical history presented with fatigue along with polydipsia and polyuria. He was found to be in DKA and started on insulin drip. Anion gap closed and insulin drip was transitioned to long-acting insulin. Patient was elected by diabetes coordinator and was switched to 70/30 insulin as per their recommendation.   Discharge Diagnoses:  Principal Problem:   DKA (diabetic ketoacidoses) ([place]) Active Problems:   Hyperkalemia   ARF (acute renal failure) ([place])   Diabetes ([place])   DKA -resolved. Off insulin drip. Switched to 70/30 insulin as per diabetes coordinator recommendation. Will discharge home on 70/30 insulin 27 units twice a day as per diabetes coordinator recommendations. - hemoglobin A1c Is still pending but can be followed up by primary care provider. Comply with diet.  - patient will benefit from outpatient follow-up with diabetes clinic and/or endocrinology - Patient was unaware of diagnosis of diabetes. Patient needs to follow up with the primary care provider ata regular basis. Currently he does not have a primary care provider. Care management has been consulted for the same  Hyperkalemia - resolved.  Probable pseudohyponatremia - Probably from hyperglycemia. Resolved  Acute kidney injury - Probably from DKA and dehydration.  Resolved  Allergies as of 06/01/2017   No Known Allergies     Medication List    TAKE these medications   blood glucose meter kit and supplies Dispense based on patient and insurance preference. Use up to four times daily as directed. (FOR ICD-9 250.00, 250.01).   insulin NPH-regular Human (70-30) 100 UNIT/ML injection Commonly known as:  NOVOLIN 70/30 RELION Inject 27 Units into the skin 2 (two) times daily with a meal.   INSULIN SYRINGE .3CC/31GX5/16" 31G X 5/16" 0.3 ML Misc Novolin 70/30 27 units bid            Discharge Care Instructions        Start     Ordered   06/01/17 0000  insulin NPH-regular Human (NOVOLIN 70/30 RELION) (70-30) 100 UNIT/ML injection  2 times daily with meals     06/01/17 0908   06/01/17 0000  blood glucose meter kit and supplies    Question Answer Comment  Number of strips 100   Number of lancets 100      06/01/17 0908   06/01/17 0000  Insulin Syringe-Needle U-100 (INSULIN SYRINGE .3CC/31GX5/16") 31G X 5/16" 0.3 ML MISC     06/01/17 0908   06/01/17 0000  Increase activity slowly     06/01/17 0908   06/01/17 0000  Diet - low sodium heart healthy     06/01/17 0908   06/01/17 0000  Diet Carb Modified     06/01/17 0908   06/01/17 0000  Call MD for:  temperature >100.4     06/01/17 0908   06/01/17 0000  Call MD for:  persistant nausea and vomiting     06/01/17 0908   06/01/17 0000  Call MD for:  severe uncontrolled pain  06/01/17 0908   06/01/17 0000  Call MD for:  difficulty breathing, headache or visual disturbances     06/01/17 0908   06/01/17 0000  Call MD for:  hives     06/01/17 0908   06/01/17 0000  Call MD for:  persistant dizziness or light-headedness     06/01/17 0908   06/01/17 0000  Call MD for:  extreme fatigue     06/01/17 0908         No Known Allergies  Consultations:  Diabetes coordinator   Procedures/Studies: Dg Chest 2 View  Result Date: 05/28/2017 CLINICAL DATA:  Dyspnea and cough. Fatigue, nausea  and increased thirst x1 week. EXAM: CHEST  2 VIEW COMPARISON:  08/22/2014 FINDINGS: The heart size and mediastinal contours are within normal limits. Both lungs are clear. The visualized skeletal structures are unremarkable. IMPRESSION: No active cardiopulmonary disease. Electronically Signed   By: Ashley Royalty M.D.   On: 05/28/2017 19:36   US Renal  Result Date: 05/28/2017 CLINICAL DATA:  Acute renal failure and intermittent abdominal pain x2 weeks. EXAM: RENAL / URINARY TRACT ULTRASOUND COMPLETE COMPARISON:  None. FINDINGS: Right Kidney: Length: 11.1 cm. Echogenicity within normal limits. No mass or hydronephrosis visualized. Left Kidney: Length: 10.8 cm. Echogenicity within normal limits. No mass or hydronephrosis visualized. Bladder: Appears normal for degree of bladder distention. IMPRESSION: No obstructive uropathy. Maintained cortical-medullary distinction within both kidneys. No sonographic findings for the patient's renal failure are identified. Electronically Signed   By: Ashley Royalty M.D.   On: 05/28/2017 22:02       Subjective: Patient seen and examined at bedside. He denies any overnight fever, nausea or vomiting. He feels much better.  Discharge Exam: Vitals:   06/01/17 0100 06/01/17 0300  BP: (!) 146/103 115/82  Pulse: 73 74  Resp: 18 18  Temp: 98.2 F (36.8 C) 98.4 F (36.9 C)  SpO2: 100% 100%   Vitals:   05/31/17 1356 05/31/17 1900 06/01/17 0100 06/01/17 0300  BP: 127/75 (!) 126/93 (!) 146/103 115/82  Pulse: 81 85 73 74  Resp: 18 18 18 18   Temp: 97.8 F (36.6 C) 98.2 F (36.8 C) 98.2 F (36.8 C) 98.4 F (36.9 C)  TempSrc: Oral Oral Oral Oral  SpO2: 99% 100% 100% 100%  Weight:      Height:        General: Pt is alert, awake, not in acute distress Cardiovascular: rate controlled, S1/S2 + Respiratory: bilateral decreased breath sound at bases Abdominal: Soft, NT, ND, bowel sounds + Extremities: no edema, no cyanosis    The results of significant diagnostics  from this hospitalization (including imaging, microbiology, ancillary and laboratory) are listed below for reference.     Microbiology: No results found for this or any previous visit (from the past 240 hour(s)).   Labs: BNP (last 3 results) No results for input(s): BNP in the last 8760 hours. Basic Metabolic Panel:  Recent Labs Lab 05/29/17 0845 05/29/17 1508 05/30/17 0641 05/31/17 0432 06/01/17 0617  NA 141 135 132* 135 138  K 5.1 3.8 3.4* 3.3* 3.5  CL 114* 107 103 104 105  CO2 19* 21* 22 26 28   GLUCOSE 148* 360* 426* 212* 264*  BUN 16 16 11 6 7   CREATININE 1.08 1.17 1.01 0.74 0.77  CALCIUM 9.0 8.5* 8.4* 8.4* 8.8*  MG 2.3  --   --  1.8 2.0   Liver Function Tests:  Recent Labs Lab 05/29/17 0845  AST 24  ALT 23  ALKPHOS  71  BILITOT 1.7*  PROT 6.6  ALBUMIN 3.5   No results for input(s): LIPASE, AMYLASE in the last 168 hours. No results for input(s): AMMONIA in the last 168 hours. CBC:  Recent Labs Lab 05/28/17 1603 05/30/17 0641 05/31/17 0432  WBC 7.6 7.1 6.1  NEUTROABS  --  4.6 2.9  HGB 15.0 14.3 13.2  HCT 45.0 42.5 38.8*  MCV 91.5 88.5 88.2  PLT 250 242 225   Cardiac Enzymes: No results for input(s): CKTOTAL, CKMB, CKMBINDEX, TROPONINI in the last 168 hours. BNP: Invalid input(s): POCBNP CBG:  Recent Labs Lab 05/31/17 0426 05/31/17 1153 05/31/17 1715 05/31/17 2049 06/01/17 0628  GLUCAP 229* 346* 223* 255* 260*   D-Dimer No results for input(s): DDIMER in the last 72 hours. Hgb A1c No results for input(s): HGBA1C in the last 72 hours. Lipid Profile No results for input(s): CHOL, HDL, LDLCALC, TRIG, CHOLHDL, LDLDIRECT in the last 72 hours. Thyroid function studies No results for input(s): TSH, T4TOTAL, T3FREE, THYROIDAB in the last 72 hours.  Invalid input(s): FREET3 Anemia work up No results for input(s): VITAMINB12, FOLATE, FERRITIN, TIBC, IRON, RETICCTPCT in the last 72 hours. Urinalysis    Component Value Date/Time   COLORURINE  COLORLESS (A) 05/28/2017 1626   APPEARANCEUR CLEAR 05/28/2017 1626   LABSPEC 1.000 (L) 05/28/2017 1626   PHURINE 5.0 05/28/2017 1626   GLUCOSEU >=500 (A) 05/28/2017 1626   HGBUR NEGATIVE 05/28/2017 1626   BILIRUBINUR NEGATIVE 05/28/2017 1626   KETONESUR 5 (A) 05/28/2017 1626   PROTEINUR NEGATIVE 05/28/2017 1626   UROBILINOGEN 0.2 08/22/2014 1607   NITRITE NEGATIVE 05/28/2017 1626   LEUKOCYTESUR NEGATIVE 05/28/2017 1626   Sepsis Labs Invalid input(s): PROCALCITONIN,  WBC,  LACTICIDVEN Microbiology No results found for this or any previous visit (from the past 240 hour(s)).   Time coordinating discharge: 35 minutes  SIGNED:   Aline August, MD  [facility] 06/01/2017, 9:10 AM Pager: [phone]  If 7PM-7AM, please contact night-coverage [email or web address] Password [email or web address]

## 2017-06-01 NOTE — Progress Notes (Addendum)
Inpatient Diabetes Program Recommendations  AACE/ADA: New Consensus Statement on Inpatient Glycemic Control (2015)  Target Ranges:  Prepandial:   less than 140 mg/dL      Peak postprandial:   less than 180 mg/dL (1-2 hours)      Critically ill patients:  140 - 180 mg/dL   Results for WEILAND, TOMICH (MRN [id number]) as of 06/01/2017 08:48  Ref. Range 05/31/2017 04:26 05/31/2017 11:53 05/31/2017 17:15 05/31/2017 20:49 06/01/2017 06:28  Glucose-Capillary Latest Ref Range: 65 - 99 mg/dL 956 (H) 213 (H) 086 (H) 255 (H) 260 (H)   Review of Glycemic Control  Diabetes history: No Outpatient Diabetes medications: NA Current orders for Inpatient glycemic control: 70/30 22 units BID, Novolog 0-15 units TID with meals, Novolog 0-5 units QHS  Inpatient Diabetes Program Recommendations: Insulin - Basal: Please consider increasing 70/30 to 27 units BID with meals. HgbA1C: A1C still in process. Outpatient Referral: Patient will need outpatient follow up arranged. CM consult already ordered.  Addendum 06/01/17@10 :19-Spoke with patient about new diabetes diagnosis.  Patient reports Cordella Register he has 2 sisters with diabetes and they were dx with DM when they were young. Patient is not sure if they are Type 1 or 2 but states they both take insulin injections. Informed patient that his A1C has been obtained but results are not in the chart yet. Explained what an A1C is. Discussed basic pathophysiology of DM Type 1 and 2, basic home care, importance of checking CBGs and maintaining good CBG control to prevent long-term and short-term complications. Reviewed glucose and A1C goals.  Reviewed signs and symptoms of hyperglycemia and hypoglycemia along with treatment for both. Discussed impact of nutrition, exercise, stress, sickness, and medications on diabetes control. Informed patient he has a consult for RD already ordered and RD will provide further diet education. Reviewed Living Well with diabetes booklet and encouraged patient  to read through entire book. Informed patient that he will be prescribed Novolin 70/30 since it is more affordable. Informed patient that Novolin 70/30 can be purchased at [facility] for $25 per vial. Provided patient with handout information on Reli-On products and encouraged patient to go to [facility] to get the Reli-On Prime glucometer for $9 and a box of 50 Reli-On test strips for $9. Discussed 70/30 insulin in detail (how to take it, when to take it). Asked patient to check his glucose 4 times per day (before meals and at bedtime) and to keep a log book of glucose readings and insulin taken. Explained how the doctor he follows up with can use the log book to continue to make insulin adjustments if needed. Reviewed and demonstrated how to draw up and administer insulin with vial and syringe. Patient was able to successfully demonstrate how to draw up and administer insulin with vial and syringe. Informed patient that RN will be asking him to self-administer insulin to ensure proper technique and ability to administer self insulin shots.  Patient does not have insurance nor PCP. Patient is [facility] in [facility] to establish care. Informed patient he has a consult with CM and they should talk with him prior to discharge to arrange follow up and assist with medications if needed. Informed patient that if he gets an appointment with the clinic he can take prescriptions to the [facility] pharmacy to get them filled at time of discharge.  Patient verbalized understanding of information discussed and he states that he has no further questions at this time related to diabetes.  RNs to provide ongoing basic DM education at bedside with this patient and engage patient to actively check blood glucose and administer insulin injections.    Thanks, Orlando Penner, RN, MSN, CDE Diabetes Coordinator Inpatient Diabetes Program [phone] (Team Pager from 8am to 5pm)

## 2017-06-03 LAB — CALCIUM / CREATININE RATIO, URINE: CALCIUM UR: 1.9 mg/dL

## 2017-06-11 ENCOUNTER — Inpatient Hospital Stay: Payer: Self-pay

## 2017-06-18 MED FILL — !NOVOLIN 70/30 100 UNITS/ML: (70-30) 100 | 19 days supply | Qty: 10 | Fill #1

## 2017-07-10 ENCOUNTER — Encounter (HOSPITAL_COMMUNITY): Payer: Self-pay

## 2017-07-10 ENCOUNTER — Other Ambulatory Visit: Payer: Self-pay

## 2017-07-10 ENCOUNTER — Emergency Department (HOSPITAL_COMMUNITY)
Admission: EM | Admit: 2017-07-10 | Discharge: 2017-07-10 | Disposition: A | Discharge status: Home / Self Care | Payer: Self-pay | Attending: Emergency Medicine | Admitting: Emergency Medicine

## 2017-07-10 DIAGNOSIS — E119 Type 2 diabetes mellitus without complications: Secondary | ICD-10-CM | POA: Insufficient documentation

## 2017-07-10 DIAGNOSIS — Z794 Long term (current) use of insulin: Secondary | ICD-10-CM | POA: Insufficient documentation

## 2017-07-10 DIAGNOSIS — Z760 Encounter for issue of repeat prescription: Secondary | ICD-10-CM | POA: Insufficient documentation

## 2017-07-10 HISTORY — DX: Type 2 diabetes mellitus without complications: E11.9

## 2017-07-10 MED ORDER — INSULIN NPH ISOPHANE & REGULAR (70-30) 100 UNIT/ML ~~LOC~~ SUSP: 27.0000 [IU] | Freq: Two times a day (BID) | SUBCUTANEOUS | 1 refills | Status: DC

## 2017-07-10 MED FILL — !NOVOLIN 70/30 100 UNITS/ML: (70-30) 100 | 18 days supply | Qty: 10 | Fill #0

## 2017-07-10 NOTE — ED Triage Notes (Signed)
Pt endorses being out of his novolog 70/30, [facility] and was sent here for prescription refill. Pt has no complaints. VSS.

## 2017-07-10 NOTE — ED Notes (Signed)
Provider at the bedside.  

## 2017-07-10 NOTE — ED Provider Notes (Signed)
[facility] EMERGENCY DEPARTMENT Provider Note   CSN: [id number] Arrival date & time: 07/10/17  1520     History   Chief Complaint Chief Complaint  Patient presents with  . Medication Refill    HPI Jesse Weiss is a 51 y.o. male w/ h/o of IDDM on novolog 70/30 BID presents to ED for medication refill. Ran out of novolong yesterday and went to [facility] to get insulin but was told to come to ED for actual prescription. Last checked him CBG lats night and was "a little low" so he ate some candy. He has no other complaints. No dry mouth, nausea, vomiting, abdominal pain, polyuria, polydipsia.   HPI  Past Medical History:  Diagnosis Date  . Diabetes mellitus without complication [facility])     Patient Active Problem List   Diagnosis Date Noted  . DKA (diabetic ketoacidoses) ([place]) 05/28/2017  . Hyperkalemia 05/28/2017  . ARF (acute renal failure) ([place]) 05/28/2017  . Diabetes ([place]) 05/28/2017  . AKI (acute kidney injury) [facility])     Past Surgical History:  Procedure Laterality Date  . SHOULDER SURGERY         Home Medications    Prior to Admission medications   Medication Sig Start Date End Date Taking? Authorizing Provider  blood glucose meter kit and supplies Dispense based on patient and insurance preference. Use up to four times daily as directed. (FOR ICD-9 250.00, 250.01). 06/01/17   Aline August, MD  insulin NPH-regular Human (NOVOLIN 70/30 RELION) (70-30) 100 UNIT/ML injection Inject 27 Units 2 (two) times daily with a meal into the skin. 07/10/17   Kinnie Feil, PA-C  Insulin Syringe-Needle U-100 (INSULIN SYRINGE .3CC/31GX5/16") 31G X 5/16" 0.3 ML MISC Novolin 70/30 27 units bid 06/01/17   Aline August, MD    Family History Family History  Problem Relation Age of Onset  . Hypertension Mother   . Diabetes Father     Social History Social History   Tobacco Use  . Smoking status: Never Smoker  . Smokeless tobacco: Never Used   Substance Use Topics  . Alcohol use: No  . Drug use: No     Allergies   Patient has no known allergies.   Review of Systems Review of Systems  All other systems reviewed and are negative.    Physical Exam Updated Vital Signs BP (!) 134/98 (BP Location: Right Arm)   Pulse 74   Temp 97.9 F (36.6 C) (Oral)   Ht 5' 11"  (1.803 m)   Wt 84.4 kg (186 lb)   SpO2 100%   BMI 25.94 kg/m   Physical Exam  Constitutional: He is oriented to person, place, and time. He appears well-developed and well-nourished. No distress.  NAD.  HENT:  Head: Normocephalic and atraumatic.  Right Ear: External ear normal.  Left Ear: External ear normal.  Nose: Nose normal.  Moist mucous membranes   Eyes: Conjunctivae and EOM are normal. No scleral icterus.  Neck: Normal range of motion. Neck supple.  Cardiovascular: Normal rate, regular rhythm, normal heart sounds and intact distal pulses.  No murmur heard. Pulmonary/Chest: Effort normal and breath sounds normal. He has no wheezes.  Abdominal: Soft. There is no tenderness.  Musculoskeletal: Normal range of motion. He exhibits no deformity.  Neurological: He is alert and oriented to person, place, and time.  Skin: Skin is warm and dry. Capillary refill takes less than 2 seconds.  Psychiatric: He has a normal mood and affect. His behavior is normal.  Judgment and thought content normal.  Nursing note and vitals reviewed.    ED Treatments / Results  Labs (all labs ordered are listed, but only abnormal results are displayed) Labs Reviewed - No data to display  EKG  EKG Interpretation None       Radiology No results found.  Procedures Procedures (including critical care time)  Medications Ordered in ED Medications - No data to display   Initial Impression / Assessment and Plan / ED Course  I have reviewed the triage vital signs and the nursing notes.  Pertinent labs & imaging results that were available during my care of the  patient were reviewed by me and considered in my medical decision making (see chart for details).    51 yo male w/ h/o IDDM on novlong 70/30 presents to med refill. Last self administered insulin yesterday. No DKA or HHS signs or symptoms. VS are WNL. [facility]. Discussed specific s/s that would warrant return to ED for re-evaluation. He verbalized understanding and agreeable with ED tx and plan.   Final Clinical Impressions(s) / ED Diagnoses   Final diagnoses:  Encounter for medication refill    ED Discharge Orders        Ordered    insulin NPH-regular Human (NOVOLIN 70/30 RELION) (70-30) 100 UNIT/ML injection  2 times daily with meals     07/10/17 1551       Arlean Hopping 07/10/17 [place], MD 07/10/17 2126

## 2017-07-10 NOTE — Discharge Instructions (Signed)
Use your novolog as prescribed (70/30 morning and night). [facility].

## 2017-07-13 ENCOUNTER — Encounter: Payer: Self-pay | Admitting: Nurse Practitioner

## 2017-07-13 ENCOUNTER — Ambulatory Visit: Payer: Self-pay | Attending: Nurse Practitioner | Admitting: Nurse Practitioner

## 2017-07-13 VITALS — BP 122/73 | HR 69 | Temp 97.8°F | Resp 18 | Ht 71.0 in | Wt 203.6 lb

## 2017-07-13 DIAGNOSIS — E1165 Type 2 diabetes mellitus with hyperglycemia: Secondary | ICD-10-CM

## 2017-07-13 DIAGNOSIS — Z794 Long term (current) use of insulin: Secondary | ICD-10-CM | POA: Insufficient documentation

## 2017-07-13 LAB — GLUCOSE, POCT (MANUAL RESULT ENTRY): POC GLUCOSE: 151 mg/dL — AB (ref 70–99)

## 2017-07-13 LAB — POCT GLYCOSYLATED HEMOGLOBIN (HGB A1C): Hemoglobin A1C: 9.7

## 2017-07-13 MED ORDER — GLUCOSE BLOOD VI STRP: ORAL_STRIP | 12 refills | Status: DC

## 2017-07-13 MED FILL — TRUE METRIX TEST STRIP: 30 days supply | Qty: 100 | Fill #0

## 2017-07-13 NOTE — Progress Notes (Signed)
Assessment & Plan:  Jesse Weiss was seen today for new patient (initial visit).  Diagnoses and all orders for this visit:  Type 2 diabetes mellitus with hyperglycemia, unspecified whether long term insulin use (HCC) -     Microalbumin/Creatinine Ratio, Urine -     HgB A1c -     CMP14+EGFR -     Glucose (CBG)  Other orders -     glucose blood (TRUE METRIX BLOOD GLUCOSE TEST) test strip; Use as instructed     Subjective:   Chief Complaint  Patient presents with  . New Patient (Initial Visit)    Patient is here for diabetes. Patient stated he already eaten.    HPI Jesse Weiss 51 y.o. male presents to office today to establish care and for follow up to diabetes mellitus.   Diabetes Mellitus Type II He was diagnosed with DM 05-2017. Currently his A1c is down from >15  to 9.7. He has his glucometer with him today for review.  CBG Averages: 7 day: 124 14 days: 142  30 days: 150  There are no hypoglycemic symptoms.  Symptoms are stable. Risk factors for coronary artery disease include family history, diabetes mellitus, and gender. Current diabetic treatment includes Novolin 70/30 27units BID. Patient is compliant with treatment all of the time and monitors blood glucose at home 2 times per day.  Weight is  stable. Patient follows a generally healthy diet. Meal planning includes avoidance of concentrated sweets. Patient has seen a dietician through his church. Patient is compliant with exercise.   An ACE inhibitor/angiotensin II receptor blocker is not being taken. He has not seen a podiatrist. Eye exam is not current.      Past Medical History:  Diagnosis Date  . Diabetes mellitus without complication [facility])     Past Surgical History:  Procedure Laterality Date  . SHOULDER SURGERY      Family History  Problem Relation Age of Onset  . Hypertension Mother   . Diabetes Father     Social History   Socioeconomic History  . Marital status: Single    Spouse name: Not on file    . Number of children: Not on file  . Years of education: Not on file  . Highest education level: Not on file  Social Needs  . Financial resource strain: Not on file  . Food insecurity - worry: Not on file  . Food insecurity - inability: Not on file  . Transportation needs - medical: Not on file  . Transportation needs - non-medical: Not on file  Occupational History  . Not on file  Tobacco Use  . Smoking status: Never Smoker  . Smokeless tobacco: Never Used  Substance and Sexual Activity  . Alcohol use: No  . Drug use: No  . Sexual activity: Not on file  Other Topics Concern  . Not on file  Social History Narrative  . Not on file    Outpatient Medications Prior to Visit  Medication Sig Dispense Refill  . blood glucose meter kit and supplies Dispense based on patient and insurance preference. Use up to four times daily as directed. (FOR ICD-9 250.00, 250.01). 1 each 0  . insulin NPH-regular Human (NOVOLIN 70/30 RELION) (70-30) 100 UNIT/ML injection Inject 27 Units 2 (two) times daily with a meal into the skin. 10 mL 1  . Insulin Syringe-Needle U-100 (INSULIN SYRINGE .3CC/31GX5/16") 31G X 5/16" 0.3 ML MISC Novolin 70/30 27 units bid 100 each 0   No facility-administered medications prior  to visit.     No Known Allergies  Review of Systems  Constitutional: Negative for fever, malaise/fatigue and weight loss.  HENT: Negative.  Negative for nosebleeds.   Eyes: Negative.  Negative for blurred vision, double vision and photophobia.  Respiratory: Negative.  Negative for cough and shortness of breath.   Cardiovascular: Negative.  Negative for chest pain, palpitations and leg swelling.  Gastrointestinal: Negative.  Negative for abdominal pain, constipation, diarrhea, heartburn, nausea and vomiting.  Musculoskeletal: Negative.  Negative for myalgias.  Neurological: Negative.  Negative for dizziness, focal weakness, seizures and headaches.  Endo/Heme/Allergies: Negative for  environmental allergies.  Psychiatric/Behavioral: Negative.  Negative for suicidal ideas.       Objective:    Physical Exam  Constitutional: He is oriented to person, place, and time. He appears well-developed and well-nourished. He is cooperative.  HENT:  Head: Normocephalic and atraumatic.  Eyes: EOM are normal.  Neck: Normal range of motion.  Cardiovascular: Normal rate, regular rhythm, normal heart sounds and intact distal pulses. Exam reveals no gallop and no friction rub.  No murmur heard. Pulmonary/Chest: Effort normal and breath sounds normal. No tachypnea. No respiratory distress. He has no decreased breath sounds. He has no wheezes. He has no rhonchi. He has no rales. He exhibits no tenderness.  Abdominal: Soft. Bowel sounds are normal.  Musculoskeletal: Normal range of motion. He exhibits no edema.  Neurological: He is alert and oriented to person, place, and time. Coordination normal.  Skin: Skin is warm and dry.  Psychiatric: He has a normal mood and affect. His behavior is normal. Judgment and thought content normal.  Nursing note and vitals reviewed.   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  07/10/17 186 lb (84.4 kg)  05/28/17 186 lb (84.4 kg)    Patient has been counseled on age-appropriate routine health concerns for screening and prevention. These are reviewed and up-to-date. Referrals have been placed accordingly. Immunizations are up-to-date or declined.       Patient has been counseled extensively about nutrition and exercise as well as the importance of adherence with medications and regular follow-up. The patient was given clear instructions to go to ER or return to  medical center if symptoms don't improve, worsen or new problems develop. The patient verbalized understanding.   Follow-up: Return in about 3 months (around 10/13/2017) for DM.   Jesse Pounds, FNP-BC [facility] [place], [place]   07/13/2017, 5:12 PM

## 2017-07-13 NOTE — Patient Instructions (Addendum)
Blood Glucose Monitoring, Adult °Monitoring your blood sugar (glucose) helps you manage your diabetes. It also helps you and your health care provider determine how well your diabetes management plan is working. Blood glucose monitoring involves checking your blood glucose as often as directed, and keeping a record (log) of your results over time. °Why should I monitor my blood glucose? °Checking your blood glucose regularly can: °· Help you understand how food, exercise, illnesses, and medicines affect your blood glucose. °· Let you know what your blood glucose is at any time. You can quickly tell if you are having low blood glucose (hypoglycemia) or high blood glucose (hyperglycemia). °· Help you and your health care provider adjust your medicines as needed. ° °When should I check my blood glucose? °Follow instructions from your health care provider about how often to check your blood glucose. This may depend on: °· The type of diabetes you have. °· How well-controlled your diabetes is. °· Medicines you are taking. ° °If you have type 1 diabetes: °· Check your blood glucose at least 2 times a day. °· Also check your blood glucose: °? Before every insulin injection. °? Before and after exercise. °? Between meals. °? 2 hours after a meal. °? Occasionally between 2:00 a.m. and 3:00 a.m., as directed. °? Before potentially dangerous tasks, like driving or using heavy machinery. °? At bedtime. °· You may need to check your blood glucose more often, up to 6-10 times a day: °? If you use an insulin pump. °? If you need multiple daily injections (MDI). °? If your diabetes is not well-controlled. °? If you are ill. °? If you have a history of severe hypoglycemia. °? If you have a history of not knowing when your blood glucose is getting low (hypoglycemia unawareness). °If you have type 2 diabetes: °· If you take insulin or other diabetes medicines, check your blood glucose at least 2 times a day. °· If you are on intensive  insulin therapy, check your blood glucose at least 4 times a day. Occasionally, you may also need to check between 2:00 a.m. and 3:00 a.m., as directed. °· Also check your blood glucose: °? Before and after exercise. °? Before potentially dangerous tasks, like driving or using heavy machinery. °· You may need to check your blood glucose more often if: °? Your medicine is being adjusted. °? Your diabetes is not well-controlled. °? You are ill. °What is a blood glucose log? °· A blood glucose log is a record of your blood glucose readings. It helps you and your health care provider: °? Look for patterns in your blood glucose over time. °? Adjust your diabetes management plan as needed. °· Every time you check your blood glucose, write down your result and notes about things that may be affecting your blood glucose, such as your diet and exercise for the day. °· Most glucose meters store a record of glucose readings in the meter. Some meters allow you to download your records to a computer. °How do I check my blood glucose? °Follow these steps to get accurate readings of your blood glucose: °Supplies needed ° °· Blood glucose meter. °· Test strips for your meter. Each meter has its own strips. You must use the strips that come with your meter. °· A needle to prick your finger (lancet). Do not use lancets more than once. °· A device that holds the lancet (lancing device). °· A journal or log book to write down your results. °Procedure °·   Wash your hands with soap and water. °· Prick the side of your finger (not the tip) with the lancet. Use a different finger each time. °· Gently rub the finger until a small drop of blood appears. °· Follow instructions that come with your meter for inserting the test strip, applying blood to the strip, and using your blood glucose meter. °· Write down your result and any notes. °Alternative testing sites °· Some meters allow you to use areas of your body other than your finger  (alternative sites) to test your blood. °· If you think you may have hypoglycemia, or if you have hypoglycemia unawareness, do not use alternative sites. Use your finger instead. °· Alternative sites may not be as accurate as the fingers, because blood flow is slower in these areas. This means that the result you get may be delayed, and it may be different from the result that you would get from your finger. °· The most common alternative sites are: °? Forearm. °? Thigh. °? Palm of the hand. °Additional tips °· Always keep your supplies with you. °· If you have questions or need help, all blood glucose meters have a 24-hour “hotline” number that you can call. You may also contact your health care provider. °· After you use a few boxes of test strips, adjust (calibrate) your blood glucose meter by following instructions that came with your meter. °This information is not intended to replace advice given to you by your health care provider. Make sure you discuss any questions you have with your health care provider. °Document Released: 08/21/2003 Document Revised: 03/07/2016 Document Reviewed: 01/28/2016 °Elsevier Interactive Patient Education © 2017 [employer] ° ° °Diabetes Mellitus and Food °It is important for you to manage your blood sugar (glucose) level. Your blood glucose level can be greatly affected by what you eat. Eating healthier foods in the appropriate amounts throughout the day at about the same time each day will help you control your blood glucose level. It can also help slow or prevent worsening of your diabetes mellitus. Healthy eating may even help you improve the level of your blood pressure and reach or maintain a healthy weight. °General recommendations for healthful eating and cooking habits include: °· Eating meals and snacks regularly. Avoid going long periods of time without eating to lose weight. °· Eating a diet that consists mainly of plant-based foods, such as fruits, vegetables,  nuts, legumes, and whole grains. °· Using low-heat cooking methods, such as baking, instead of high-heat cooking methods, such as deep frying. ° °Work with your dietitian to make sure you understand how to use the Nutrition Facts information on food labels. °How can food affect me? °Carbohydrates °Carbohydrates affect your blood glucose level more than any other type of food. Your dietitian will help you determine how many carbohydrates to eat at each meal and teach you how to count carbohydrates. Counting carbohydrates is important to keep your blood glucose at a healthy level, especially if you are using insulin or taking certain medicines for diabetes mellitus. °Alcohol °Alcohol can cause sudden decreases in blood glucose (hypoglycemia), especially if you use insulin or take certain medicines for diabetes mellitus. Hypoglycemia can be a life-threatening condition. Symptoms of hypoglycemia (sleepiness, dizziness, and disorientation) are similar to symptoms of having too much alcohol. °If your health care provider has given you approval to drink alcohol, do so in moderation and use the following guidelines: °· Women should not have more than one drink per day, and men   should not have more than two drinks per day. One drink is equal to: °? 12 oz of beer. °? 5 oz of wine. °? 1½ oz of hard liquor. °· Do not drink on an empty stomach. °· Keep yourself hydrated. Have water, diet soda, or unsweetened iced tea. °· Regular soda, juice, and other mixers might contain a lot of carbohydrates and should be counted. ° °What foods are not recommended? °As you make food choices, it is important to remember that all foods are not the same. Some foods have fewer nutrients per serving than other foods, even though they might have the same number of calories or carbohydrates. It is difficult to get your body what it needs when you eat foods with fewer nutrients. Examples of foods that you should avoid that are high in calories and  carbohydrates but low in nutrients include: °· Trans fats (most processed foods list trans fats on the Nutrition Facts label). °· Regular soda. °· Juice. °· Candy. °· Sweets, such as cake, pie, doughnuts, and cookies. °· Fried foods. ° °What foods can I eat? °Eat nutrient-rich foods, which will nourish your body and keep you healthy. The food you should eat also will depend on several factors, including: °· The calories you need. °· The medicines you take. °· Your weight. °· Your blood glucose level. °· Your blood pressure level. °· Your cholesterol level. ° °You should eat a variety of foods, including: °· Protein. °? Lean cuts of meat. °? Proteins low in saturated fats, such as fish, egg whites, and beans. Avoid processed meats. °· Fruits and vegetables. °? Fruits and vegetables that may help control blood glucose levels, such as apples, mangoes, and yams. °· Dairy products. °? Choose fat-free or low-fat dairy products, such as milk, yogurt, and cheese. °· Grains, bread, pasta, and rice. °? Choose whole grain products, such as multigrain bread, whole oats, and brown rice. These foods may help control blood pressure. °· Fats. °? Foods containing healthful fats, such as nuts, avocado, olive oil, canola oil, and fish. ° °Does everyone with diabetes mellitus have the same meal plan? °Because every person with diabetes mellitus is different, there is not one meal plan that works for everyone. It is very important that you meet with a dietitian who will help you create a meal plan that is just right for you. °This information is not intended to replace advice given to you by your health care provider. Make sure you discuss any questions you have with your health care provider. °Document Released: 05/15/2005 Document Revised: 01/24/2016 Document Reviewed: 07/15/2013 °Elsevier Interactive Patient Education © 2017 [employer] ° °

## 2017-07-14 LAB — CMP14+EGFR
A/G RATIO: 1.8 (ref 1.2–2.2)
ALT: 20 IU/L (ref 0–44)
AST: 20 IU/L (ref 0–40)
Albumin: 4.3 g/dL (ref 3.5–5.5)
Alkaline Phosphatase: 59 IU/L (ref 39–117)
BUN/Creatinine Ratio: 10 (ref 9–20)
BUN: 9 mg/dL (ref 6–24)
Bilirubin Total: 0.2 mg/dL (ref 0.0–1.2)
CALCIUM: 8.9 mg/dL (ref 8.7–10.2)
CO2: 22 mmol/L (ref 20–29)
Chloride: 104 mmol/L (ref 96–106)
Creatinine, Ser: 0.9 mg/dL (ref 0.76–1.27)
GFR, EST AFRICAN AMERICAN: 114 mL/min/{1.73_m2} (ref >59)
GFR, EST NON AFRICAN AMERICAN: 99 mL/min/{1.73_m2} (ref >59)
GLUCOSE: 172 mg/dL — AB (ref 65–99)
Globulin, Total: 2.4 g/dL (ref 1.5–4.5)
POTASSIUM: 3.8 mmol/L (ref 3.5–5.2)
Sodium: 141 mmol/L (ref 134–144)
TOTAL PROTEIN: 6.7 g/dL (ref 6.0–8.5)

## 2017-07-14 LAB — MICROALBUMIN / CREATININE URINE RATIO
CREATININE, UR: 467.1 mg/dL
MICROALB/CREAT RATIO: 6.4 mg/g{creat} (ref 0.0–30.0)
MICROALBUM., U, RANDOM: 29.9 ug/mL

## 2017-07-21 ENCOUNTER — Ambulatory Visit: Payer: Self-pay | Attending: Nurse Practitioner

## 2017-07-21 MED FILL — !NOVOLIN 70/30 100 UNITS/ML: (70-30) 100 | 18 days supply | Qty: 10 | Fill #1

## 2017-08-17 ENCOUNTER — Other Ambulatory Visit: Payer: Self-pay | Admitting: Nurse Practitioner

## 2017-08-17 MED ORDER — INSULIN NPH ISOPHANE & REGULAR (70-30) 100 UNIT/ML ~~LOC~~ SUSP: 27.0000 [IU] | Freq: Two times a day (BID) | SUBCUTANEOUS | 6 refills | Status: DC

## 2017-08-17 MED FILL — !NOVOLIN 70/30 100 UNITS/ML: (70-30) 100 | 18 days supply | Qty: 10 | Fill #0

## 2017-09-15 MED FILL — !NOVOLIN 70/30 100 UNITS/ML: (70-30) 100 | 18 days supply | Qty: 10 | Fill #1

## 2017-10-08 MED FILL — !NOVOLIN 70/30 100 UNITS/ML: (70-30) 100 | 18 days supply | Qty: 10 | Fill #2

## 2017-10-13 ENCOUNTER — Ambulatory Visit: Payer: Self-pay | Attending: Nurse Practitioner | Admitting: Nurse Practitioner

## 2017-10-13 ENCOUNTER — Encounter: Payer: Self-pay | Admitting: Nurse Practitioner

## 2017-10-13 VITALS — BP 151/100 | HR 73 | Temp 98.2°F | Ht 71.0 in | Wt 216.6 lb

## 2017-10-13 DIAGNOSIS — Z794 Long term (current) use of insulin: Secondary | ICD-10-CM | POA: Insufficient documentation

## 2017-10-13 DIAGNOSIS — R030 Elevated blood-pressure reading, without diagnosis of hypertension: Secondary | ICD-10-CM

## 2017-10-13 DIAGNOSIS — E1165 Type 2 diabetes mellitus with hyperglycemia: Secondary | ICD-10-CM

## 2017-10-13 DIAGNOSIS — R531 Weakness: Secondary | ICD-10-CM | POA: Insufficient documentation

## 2017-10-13 LAB — GLUCOSE, POCT (MANUAL RESULT ENTRY): POC Glucose: 136 mg/dl — AB (ref 70–99)

## 2017-10-13 NOTE — Patient Instructions (Addendum)
Hemoglobin A1c Test Some of the sugar (glucose) that circulates in your blood sticks or binds to blood proteins. Hemoglobin (Hb or Hgb) is one type of blood protein that glucose binds to. It also carries oxygen in the red blood cells (RBCs). When glucose binds to Hb, the glucose-coated Hb is called glycated Hb. Once Hb is glycated, it remains that way for the life of the RBC. This is about 120 days. Rather than testing your blood glucose level on one single day, the hemoglobin A1c (HbA1c) test measures the average amount of glycated hemoglobin and, therefore, the average amount of glucose in your blood during the 3-4 months just before the test is done. The HbA1c test is used to monitor long-term control of blood sugar in people who have diabetes mellitus. The HbA1c test can also be used in addition to or in combination with fasting blood glucose level and oral glucose tolerance tests. What do the results mean? It is your responsibility to obtain your test results. Ask the lab or department performing the test when and how you will get your results. Contact your health care provider to discuss any questions you have about your results. Range of Normal Values Ranges for normal values may vary among different labs and hospitals. You should always check with your health care provider after having lab work or other tests done to discuss the meaning of your test results and whether your values are considered within normal limits. The ranges for normal HbA1c test results are as follows:  Adult or child without diabetes: 4-5.9%.  Adult or child with diabetes and good blood glucose control: less than 6.5%.  Several factors can affect HbA1c test results. These may include:  Diseases (hemoglobinopathies) that cause a change in the shape, size, or amount of Hb in your blood.  Longer than normal RBC life span.  Abnormally low levels of certain proteins in your blood.  Eating foods or taking supplements that  are high in vitamin C (ascorbic acid).  Meaning of Results Outside Normal Value Ranges Abnormally high HbA1c values are most commonly an indication of prediabetes mellitus and diabetes mellitus:  An HbA1c result of 5.7-6.4% is considered diagnostic of prediabetes mellitus.  An HbA1c result of 6.5% or higher on two separate occasions is considered diagnostic of diabetes mellitus.  Abnormally low HbA1c values can be caused by several health conditions. These may include:  Pregnancy.  A large amount of blood loss.  Blood transfusions.  Low red blood cell count (anemia). This is caused by premature destruction of red blood cells.  Long-term kidney failure.  Some unusual forms of Hb (Hb variants), such as sickle cell trait.  Discuss your test results with your health care provider. He or she will use the results to make a diagnosis and determine a treatment plan that is right for you. Talk with your health care provider to discuss your results, treatment options, and if necessary, the need for more tests. Talk with your health care provider if you have any questions about your results. This information is not intended to replace advice given to you by your health care provider. Make sure you discuss any questions you have with your health care provider. Document Released: 09/09/2004 Document Revised: 05/14/2016 Document Reviewed: 01/02/2014 Elsevier Interactive Patient Education  2018 [employer] Diabetes Mellitus and Nutrition When you have diabetes (diabetes mellitus), it is very important to have healthy eating habits because your blood sugar (glucose) levels are greatly affected by what you eat   and drink. Eating healthy foods in the appropriate amounts, at about the same times every day, can help you:  Control your blood glucose.  Lower your risk of heart disease.  Improve your blood pressure.  Reach or maintain a healthy weight.  Every person with diabetes is different, and  each person has different needs for a meal plan. Your health care provider may recommend that you work with a diet and nutrition specialist (dietitian) to make a meal plan that is best for you. Your meal plan may vary depending on factors such as:  The calories you need.  The medicines you take.  Your weight.  Your blood glucose, blood pressure, and cholesterol levels.  Your activity level.  Other health conditions you have, such as heart or kidney disease.  How do carbohydrates affect me? Carbohydrates affect your blood glucose level more than any other type of food. Eating carbohydrates naturally increases the amount of glucose in your blood. Carbohydrate counting is a method for keeping track of how many carbohydrates you eat. Counting carbohydrates is important to keep your blood glucose at a healthy level, especially if you use insulin or take certain oral diabetes medicines. It is important to know how many carbohydrates you can safely have in each meal. This is different for every person. Your dietitian can help you calculate how many carbohydrates you should have at each meal and for snack. Foods that contain carbohydrates include:  Bread, cereal, rice, pasta, and crackers.  Potatoes and corn.  Peas, beans, and lentils.  Milk and yogurt.  Fruit and juice.  Desserts, such as cakes, cookies, ice cream, and candy.  How does alcohol affect me? Alcohol can cause a sudden decrease in blood glucose (hypoglycemia), especially if you use insulin or take certain oral diabetes medicines. Hypoglycemia can be a life-threatening condition. Symptoms of hypoglycemia (sleepiness, dizziness, and confusion) are similar to symptoms of having too much alcohol. If your health care provider says that alcohol is safe for you, follow these guidelines:  Limit alcohol intake to no more than 1 drink per day for nonpregnant women and 2 drinks per day for men. One drink equals 12 oz of beer, 5 oz of  wine, or 1 oz of hard liquor.  Do not drink on an empty stomach.  Keep yourself hydrated with water, diet soda, or unsweetened iced tea.  Keep in mind that regular soda, juice, and other mixers may contain a lot of sugar and must be counted as carbohydrates.  What are tips for following this plan? Reading food labels  Start by checking the serving size on the label. The amount of calories, carbohydrates, fats, and other nutrients listed on the label are based on one serving of the food. Many foods contain more than one serving per package.  Check the total grams (g) of carbohydrates in one serving. You can calculate the number of servings of carbohydrates in one serving by dividing the total carbohydrates by 15. For example, if a food has 30 g of total carbohydrates, it would be equal to 2 servings of carbohydrates.  Check the number of grams (g) of saturated and trans fats in one serving. Choose foods that have low or no amount of these fats.  Check the number of milligrams (mg) of sodium in one serving. Most people should limit total sodium intake to less than 2,300 mg per day.  Always check the nutrition information of foods labeled as "low-fat" or "nonfat". These foods may be higher in   added sugar or refined carbohydrates and should be avoided.  Talk to your dietitian to identify your daily goals for nutrients listed on the label. Shopping  Avoid buying canned, premade, or processed foods. These foods tend to be high in fat, sodium, and added sugar.  Shop around the outside edge of the grocery store. This includes fresh fruits and vegetables, bulk grains, fresh meats, and fresh dairy. Cooking  Use low-heat cooking methods, such as baking, instead of high-heat cooking methods like deep frying.  Cook using healthy oils, such as olive, canola, or sunflower oil.  Avoid cooking with butter, cream, or high-fat meats. Meal planning  Eat meals and snacks regularly, preferably at the  same times every day. Avoid going long periods of time without eating.  Eat foods high in fiber, such as fresh fruits, vegetables, beans, and whole grains. Talk to your dietitian about how many servings of carbohydrates you can eat at each meal.  Eat 4-6 ounces of lean protein each day, such as lean meat, chicken, fish, eggs, or tofu. 1 ounce is equal to 1 ounce of meat, chicken, or fish, 1 egg, or 1/4 cup of tofu.  Eat some foods each day that contain healthy fats, such as avocado, nuts, seeds, and fish. Lifestyle   Check your blood glucose regularly.  Exercise at least 30 minutes 5 or more days each week, or as told by your health care provider.  Take medicines as told by your health care provider.  Do not use any products that contain nicotine or tobacco, such as cigarettes and e-cigarettes. If you need help quitting, ask your health care provider.  Work with a counselor or diabetes educator to identify strategies to manage stress and any emotional and social challenges. What are some questions to ask my health care provider?  Do I need to meet with a diabetes educator?  Do I need to meet with a dietitian?  What number can I call if I have questions?  When are the best times to check my blood glucose? Where to find more information:  American Diabetes Association: diabetes.org/food-and-fitness/food  Academy of Nutrition and Dietetics: www.eatright.org/resources/health/diseases-and-conditions/diabetes  National Institute of Diabetes and Digestive and Kidney Diseases (NIH): www.niddk.nih.gov/health-information/diabetes/overview/diet-eating-physical-activity Summary  A healthy meal plan will help you control your blood glucose and maintain a healthy lifestyle.  Working with a diet and nutrition specialist (dietitian) can help you make a meal plan that is best for you.  Keep in mind that carbohydrates and alcohol have immediate effects on your blood glucose levels. It is  important to count carbohydrates and to use alcohol carefully. This information is not intended to replace advice given to you by your health care provider. Make sure you discuss any questions you have with your health care provider. Document Released: 05/15/2005 Document Revised: 09/22/2016 Document Reviewed: 09/22/2016 Else[employer] Interactive Patient Education  2018 [employer]  

## 2017-10-13 NOTE — Progress Notes (Signed)
Assessment & Plan:  Jesse Weiss was seen today for follow-up and weakness.  Diagnoses and all orders for this visit:  Type 2 diabetes mellitus with hyperglycemia, unspecified whether long term insulin use (HCC) -     Glucose (CBG) -     Hemoglobin A1c -     Ambulatory referral to Gastroenterology Diabetes Mellitus, Type 2 : Controlled Continue medications. Keep blood sugar logs as instructed.   Elevated blood pressure reading DASH/Mediterranean Diets are healthier choices for HTN.     Patient has been counseled on age-appropriate routine health concerns for screening and prevention. These are reviewed and up-to-date. Referrals have been placed accordingly. Immunizations are up-to-date or declined.    Subjective:   Chief Complaint  Patient presents with  . Follow-up    Patient is here for a follow-up for diabetes. Patient brought his blood sugar meter in today.  . Weakness    Patient stated he sometimes get a weaknes feeling on his left abdomen with no pain.    HPI Jesse Weiss 52 y.o. male presents to office today for follow up diabetes. His blood pressure is elevated today. He denies any previous history of HTN. We discussed dietary and exercise modifications. Will recheck BP in a few weeks. Denies chest pain, shortness of breath, visual disturbances, palpitations, lightheadedness, dizziness, headaches or BLE edema.  BP Readings from Last 3 Encounters:  10/13/17 (!) 151/100  07/13/17 122/73  07/10/17 (!) 134/98   DM Type 2 Chronic. Stable. Well controlled. He has his meter with him today for review. Averages: 7 days 130; 14 days 133; 30 days 136. Overall doing well. Denies any hyper or hypoglycemic symptoms.  Lab Results  Component Value Date   HGBA1C 7.0 (H) 10/13/2017     Review of Systems  Constitutional: Negative for fever, malaise/fatigue and weight loss.  HENT: Negative.  Negative for nosebleeds.   Eyes: Negative.  Negative for blurred vision, double vision and  photophobia.  Respiratory: Negative.  Negative for cough and shortness of breath.   Cardiovascular: Negative.  Negative for chest pain, palpitations and leg swelling.  Gastrointestinal: Negative.  Negative for abdominal pain, constipation, diarrhea, heartburn, nausea and vomiting.  Musculoskeletal: Negative.  Negative for myalgias.  Neurological: Negative.  Negative for dizziness, focal weakness, seizures and headaches.  Endo/Heme/Allergies: Negative for environmental allergies.  Psychiatric/Behavioral: Negative.  Negative for suicidal ideas.    Past Medical History:  Diagnosis Date  . Diabetes mellitus without complication [facility])     Past Surgical History:  Procedure Laterality Date  . SHOULDER SURGERY      Family History  Problem Relation Age of Onset  . Hypertension Mother   . Diabetes Father     Social History Reviewed with no changes to be made today.   Outpatient Medications Prior to Visit  Medication Sig Dispense Refill  . blood glucose meter kit and supplies Dispense based on patient and insurance preference. Use up to four times daily as directed. (FOR ICD-9 250.00, 250.01). 1 each 0  . glucose blood (TRUE METRIX BLOOD GLUCOSE TEST) test strip Use as instructed 100 each 12  . Insulin Syringe-Needle U-100 (INSULIN SYRINGE .3CC/31GX5/16") 31G X 5/16" 0.3 ML MISC Novolin 70/30 27 units bid 100 each 0  . insulin NPH-regular Human (NOVOLIN 70/30 RELION) (70-30) 100 UNIT/ML injection Inject 27 Units into the skin 2 (two) times daily with a meal. 10 mL 6   No facility-administered medications prior to visit.     No Known Allergies  Objective:    BP (!) 151/100 (BP Location: Right Arm, Cuff Size: Large)   Pulse 73   Temp 98.2 F (36.8 C) (Oral)   Ht _0  (1.803 m)   Wt 216 lb 9.6 oz (98.2 kg)   SpO2 97%   BMI 30.21 kg/m  Wt Readings from Last 3 Encounters:  10/13/17 216 lb 9.6 oz (98.2 kg)  07/13/17 203 lb 9.6 oz (92.4 kg)  07/10/17 186 lb (84.4 kg)     Physical Exam  Constitutional: He is oriented to person, place, and time. He appears well-developed and well-nourished. He is cooperative.  HENT:  Head: Normocephalic and atraumatic.  Eyes: EOM are normal.  Neck: Normal range of motion.  Cardiovascular: Normal rate, regular rhythm, normal heart sounds and intact distal pulses. Exam reveals no gallop and no friction rub.  No murmur heard. Pulmonary/Chest: Effort normal and breath sounds normal. No tachypnea. No respiratory distress. He has no decreased breath sounds. He has no wheezes. He has no rhonchi. He has no rales. He exhibits no tenderness.  Abdominal: Soft. Bowel sounds are normal.  Musculoskeletal: Normal range of motion. He exhibits no edema.  Neurological: He is alert and oriented to person, place, and time. Coordination normal.  Skin: Skin is warm and dry.  Psychiatric: He has a normal mood and affect. His behavior is normal. Judgment and thought content normal.  Nursing note and vitals reviewed.     Patient has been counseled extensively about nutrition and exercise as well as the importance of adherence with medications and regular follow-up. The patient was given clear instructions to go to ER or return to medical center if symptoms don't improve, worsen or new problems develop. The patient verbalized understanding.   Follow-up: Return in about 2 weeks (around 10/27/2017) for BP recheck and fasting labs.   [name], FNP-BC [facility] and [place], [place]   10/17/2017, 12:49 PM

## 2017-10-14 ENCOUNTER — Telehealth: Payer: Self-pay

## 2017-10-14 LAB — HEMOGLOBIN A1C
Est. average glucose Bld gHb Est-mCnc: 154 mg/dL
HEMOGLOBIN A1C: 7 % — AB (ref 4.8–5.6)

## 2017-10-14 NOTE — Telephone Encounter (Signed)
CMA called patient to inform on lab results.  Patient understood.  

## 2017-10-14 NOTE — Telephone Encounter (Signed)
-----   Message from Claiborne RiggZelda W Fleming, NP sent at 10/14/2017  1:12 PM EST ----- Doing a great job!!! A1c is down from 9.7 to 7.0!!!! Keep eating healthy and drinking plenty of water. Goal is less than 7 but you are almost there!

## 2017-10-17 ENCOUNTER — Encounter: Payer: Self-pay | Admitting: Nurse Practitioner

## 2017-10-26 MED FILL — TRUE METRIX TEST STRIP: 30 days supply | Qty: 100 | Fill #1

## 2017-10-26 MED FILL — !NOVOLIN 70/30 100 UNITS/ML: (70-30) 100 | 18 days supply | Qty: 10 | Fill #3

## 2017-10-27 ENCOUNTER — Other Ambulatory Visit: Payer: Self-pay | Admitting: Nurse Practitioner

## 2017-10-27 ENCOUNTER — Ambulatory Visit: Payer: Self-pay

## 2017-10-27 ENCOUNTER — Ambulatory Visit: Payer: Self-pay | Attending: Nurse Practitioner | Admitting: *Deleted

## 2017-10-27 VITALS — BP 147/96 | HR 66

## 2017-10-27 DIAGNOSIS — R030 Elevated blood-pressure reading, without diagnosis of hypertension: Secondary | ICD-10-CM

## 2017-10-27 DIAGNOSIS — Z0000 Encounter for general adult medical examination without abnormal findings: Secondary | ICD-10-CM

## 2017-10-27 MED ORDER — LOSARTAN POTASSIUM 50 MG PO TABS
50.0000 mg | ORAL_TABLET | Freq: Every day | ORAL | 0 refills | Status: DC
Start: 2017-10-27 — End: 2018-01-27

## 2017-10-27 MED FILL — LOSARTAN POTASSIUM 50 MG TA: 50 | 30 days supply | Qty: 30 | Fill #0

## 2017-10-27 NOTE — Patient Instructions (Signed)
Start Losartan 50 mg  Return to office for BP check in 2 weeks

## 2017-10-27 NOTE — Progress Notes (Signed)
Patient here for lab visit only 

## 2017-10-27 NOTE — Progress Notes (Signed)
[facility] and oriented and arrives in good spirits. At last OV with PCP, BP reading was 150/100. Pt came in today for BP check.  Pt denies chest pain, SOB, HA, dizziness, or blurred vision. Verified medication with patient. He does not take medications for Hypertension. He has taken other medications today.   Blood pressure reading: 153/104 and 147/96  Plan- Start Losartan 50 mg daily.  F/u in 2 weeks to recheck BP: Tuesday, March 12,2019 @0945 .

## 2017-10-28 LAB — CBC
Hematocrit: 46.7 % (ref 37.5–51.0)
Hemoglobin: 15.4 g/dL (ref 13.0–17.7)
MCH: 29.6 pg (ref 26.6–33.0)
MCHC: 33 g/dL (ref 31.5–35.7)
MCV: 90 fL (ref 79–97)
PLATELETS: 304 10*3/uL (ref 150–379)
RBC: 5.21 x10E6/uL (ref 4.14–5.80)
RDW: 13.9 % (ref 12.3–15.4)
WBC: 7.6 10*3/uL (ref 3.4–10.8)

## 2017-10-28 LAB — BASIC METABOLIC PANEL
BUN / CREAT RATIO: 11 (ref 9–20)
BUN: 10 mg/dL (ref 6–24)
CHLORIDE: 104 mmol/L (ref 96–106)
CO2: 24 mmol/L (ref 20–29)
CREATININE: 0.94 mg/dL (ref 0.76–1.27)
Calcium: 9 mg/dL (ref 8.7–10.2)
GFR calc Af Amer: 108 mL/min/{1.73_m2} (ref >59)
GFR calc non Af Amer: 93 mL/min/{1.73_m2} (ref >59)
GLUCOSE: 59 mg/dL — AB (ref 65–99)
Potassium: 4 mmol/L (ref 3.5–5.2)
Sodium: 142 mmol/L (ref 134–144)

## 2017-10-28 LAB — LIPID PANEL
CHOL/HDL RATIO: 4 ratio (ref 0.0–5.0)
Cholesterol, Total: 151 mg/dL (ref 100–199)
HDL: 38 mg/dL — AB (ref >39)
LDL Calculated: 95 mg/dL (ref 0–99)
Triglycerides: 90 mg/dL (ref 0–149)
VLDL CHOLESTEROL CAL: 18 mg/dL (ref 5–40)

## 2017-11-02 ENCOUNTER — Telehealth: Payer: Self-pay

## 2017-11-02 NOTE — Telephone Encounter (Signed)
-----   Message from Claiborne RiggZelda W Fleming, NP sent at 11/01/2017  8:28 PM EST ----- Labs are essentially normal. Except glucose is low. Let's cut back on your insulin to 20 units twice a day for now.

## 2017-11-02 NOTE — Telephone Encounter (Signed)
CMA attempt to call patient regarding lab results and PCP advising.  No answer and left a voicemail to call back.  If patient call back please inform:  Labs are essentially normal. Except glucose is low. Let's cut back on your insulin to 20 units twice a day for now.

## 2017-11-02 NOTE — Telephone Encounter (Deleted)
-----   Message from Zelda W Fleming, NP sent at 11/01/2017  8:28 PM EST ----- Labs are essentially normal. Except glucose is low. Let's cut back on your insulin to 20 units twice a day for now. 

## 2017-11-10 ENCOUNTER — Ambulatory Visit: Payer: Self-pay | Attending: Nurse Practitioner | Admitting: *Deleted

## 2017-11-10 VITALS — BP 135/94 | HR 82

## 2017-11-10 DIAGNOSIS — R030 Elevated blood-pressure reading, without diagnosis of hypertension: Secondary | ICD-10-CM | POA: Insufficient documentation

## 2017-11-10 MED FILL — !NOVOLIN 70/30 100 UNITS/ML: (70-30) 100 | 18 days supply | Qty: 10 | Fill #4

## 2017-11-10 NOTE — Progress Notes (Signed)
Pt states he has been taking BP medication x 2 weeks   [facility]he[facility]. Pt is alert and oriented and arrives in good spirits with wife and son. At last visit 3 encounters,  Pt blood pressure reading have been 07/13/17- 122/73 10/13/17-151/100 10/27/17 147/96 Today BP reading is 135/94 Pt denies chest pain, SOB, dizziness, or blurred vision. He c/o intermittent headache couple of time/ week relieved with Tylenol since starting mediation.  Verified medication with patient. He states he has been taking BP medication x 2 weeks. He took medication this morning.

## 2017-11-11 ENCOUNTER — Encounter: Payer: Self-pay | Admitting: Nurse Practitioner

## 2017-12-04 MED FILL — TRUE METRIX TEST STRIP: 30 days supply | Qty: 100 | Fill #2

## 2017-12-08 MED FILL — LOSARTAN POTASSIUM 50 MG TA: 50 | 30 days supply | Qty: 30 | Fill #1

## 2017-12-08 MED FILL — !NOVOLIN 70/30 100 UNITS/ML: (70-30) 100 | 18 days supply | Qty: 10 | Fill #5

## 2017-12-15 MED FILL — !NOVOLIN 70/30 100 UNITS/ML: (70-30) 100 | 18 days supply | Qty: 10 | Fill #6

## 2018-01-01 ENCOUNTER — Other Ambulatory Visit: Payer: Self-pay | Admitting: Nurse Practitioner

## 2018-01-01 MED FILL — !NOVOLIN 70/30 100 UNITS/ML: (70-30) 100 | 18 days supply | Qty: 10 | Fill #0

## 2018-01-01 MED FILL — TRUE METRIX TEST STRIP: 30 days supply | Qty: 100 | Fill #3

## 2018-01-07 MED FILL — LOSARTAN POTASSIUM 50 MG TA: 50 | 30 days supply | Qty: 30 | Fill #2

## 2018-01-27 ENCOUNTER — Other Ambulatory Visit: Payer: Self-pay | Admitting: Nurse Practitioner

## 2018-01-27 MED FILL — !NOVOLIN 70/30 100 UNITS/ML: (70-30) 100 | 18 days supply | Qty: 10 | Fill #1

## 2018-02-19 MED FILL — LOSARTAN POTASSIUM 50 MG TA: 50 | 30 days supply | Qty: 30 | Fill #0

## 2018-02-19 MED FILL — !NOVOLIN 70/30 100 UNITS/ML: (70-30) 100 | 18 days supply | Qty: 10 | Fill #2

## 2018-02-19 MED FILL — TRUE METRIX TEST STRIP: 30 days supply | Qty: 100 | Fill #4

## 2018-03-25 MED FILL — LOSARTAN POTASSIUM 50 MG TA: 50 | 30 days supply | Qty: 30 | Fill #1

## 2018-03-25 MED FILL — !NOVOLIN 70/30 100 UNITS/ML: (70-30) 100 | 36 days supply | Qty: 20 | Fill #3

## 2018-04-07 ENCOUNTER — Ambulatory Visit: Payer: Self-pay

## 2018-04-12 ENCOUNTER — Ambulatory Visit: Payer: Self-pay | Attending: Nurse Practitioner

## 2018-04-29 MED FILL — TRUE METRIX TEST STRIP: 30 days supply | Qty: 100 | Fill #5

## 2018-04-29 MED FILL — LOSARTAN POTASSIUM 50 MG TA: 50 | 30 days supply | Qty: 30 | Fill #2

## 2018-04-29 MED FILL — !NOVOLIN 70/30 100 UNITS/ML: (70-30) 100 | 36 days supply | Qty: 20 | Fill #4

## 2018-06-08 IMAGING — US US RENAL
1 series · 14 of 21 positions shown · non-contrast
Comparison: None.

CLINICAL DATA: Acute renal failure and intermittent abdominal pain
x2 weeks.

EXAM:
RENAL / URINARY TRACT ULTRASOUND COMPLETE

[Series 1: us renal · 0.22mm/px · 14 of 21 slices shown]
[im 1/21]
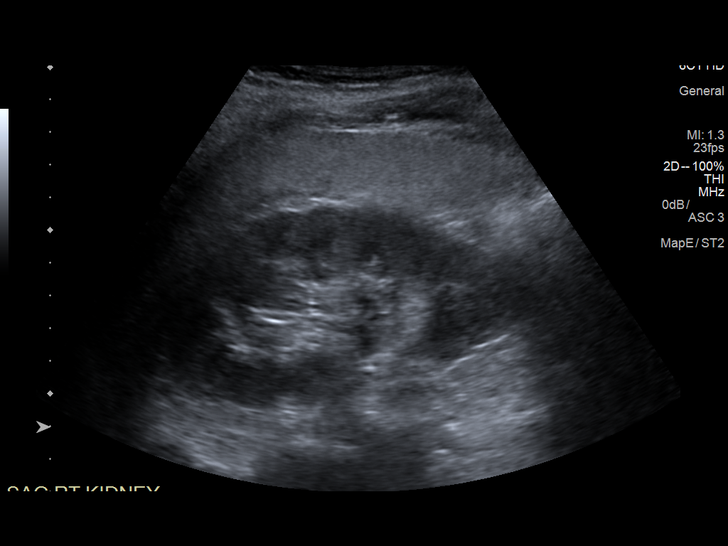
[im 3/21]
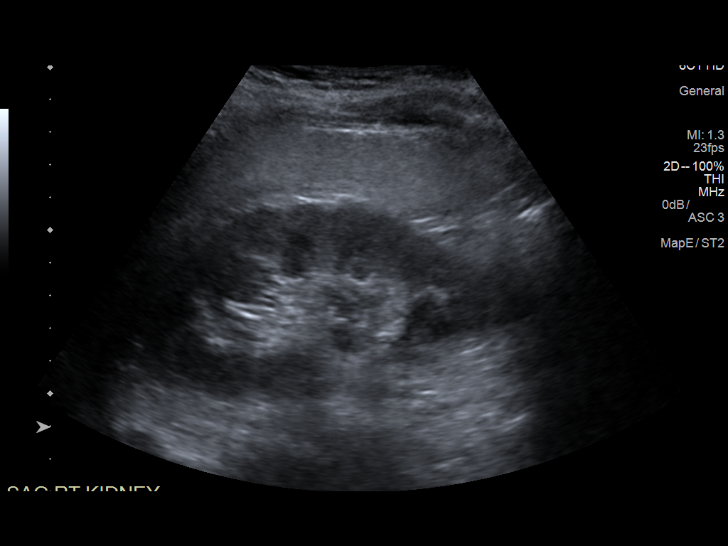
[im 4/21]
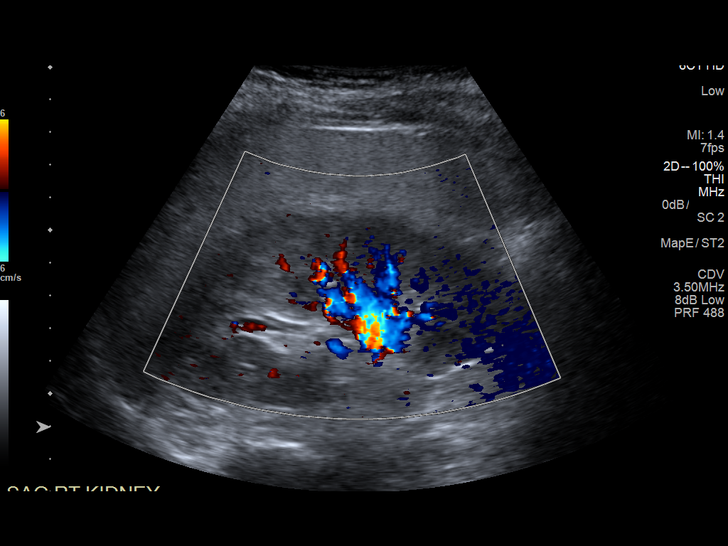
[im 6/21]
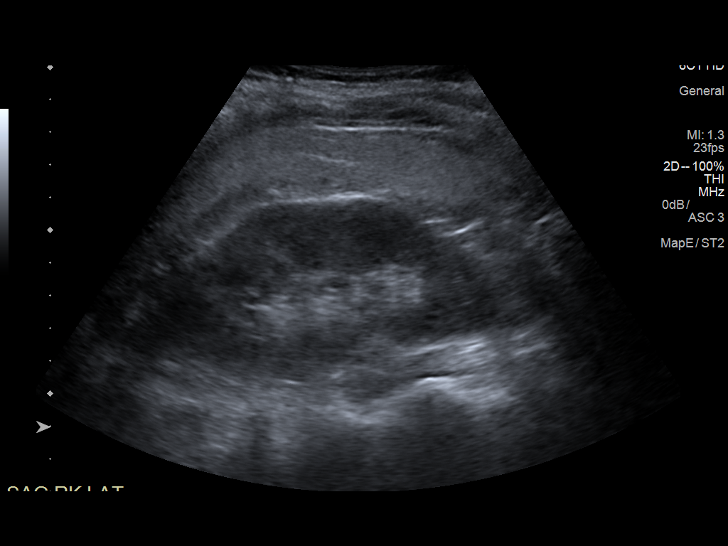
[im 7/21]
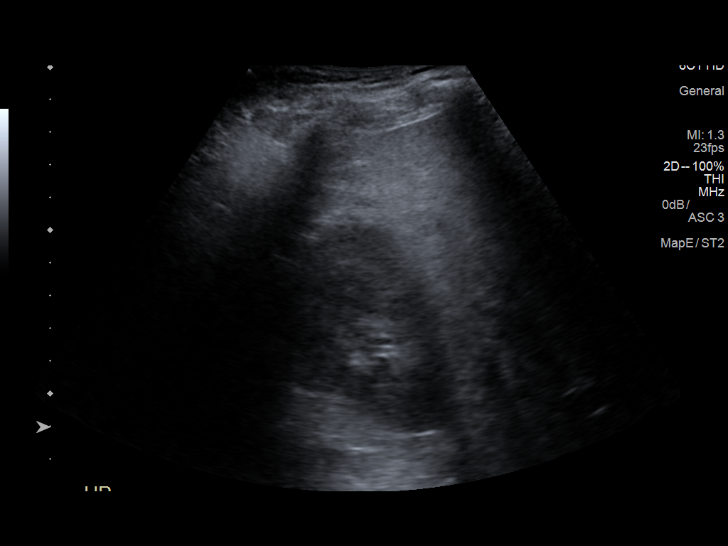
[im 9/21]
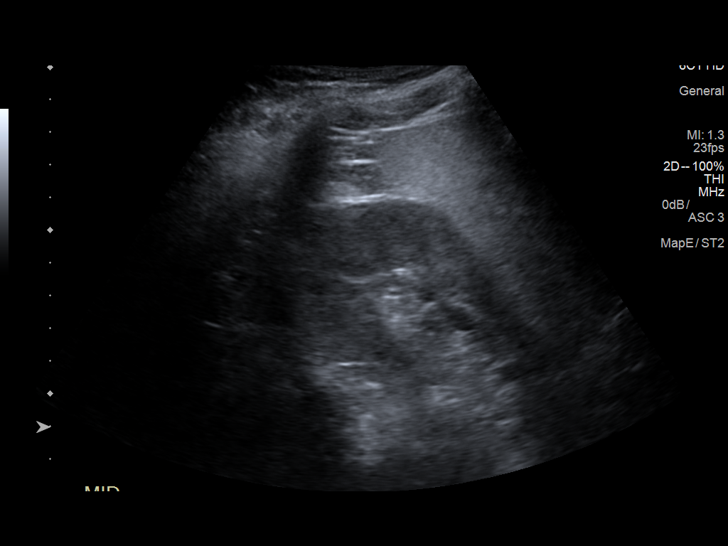
[im 10/21]
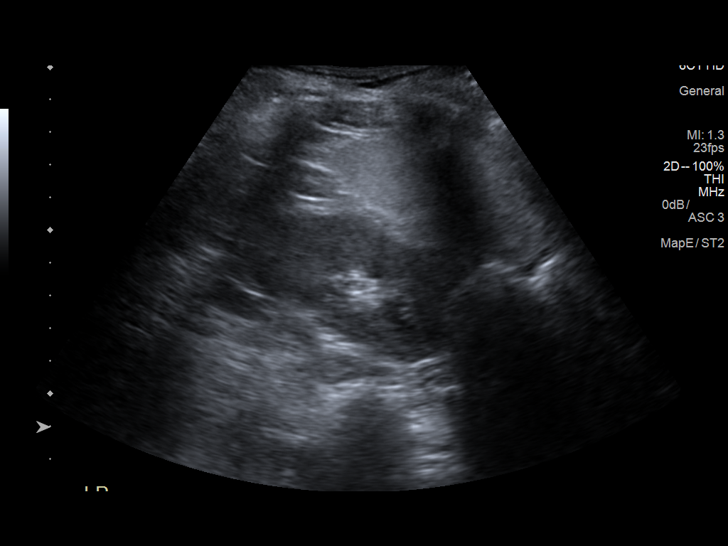
[im 12/21]
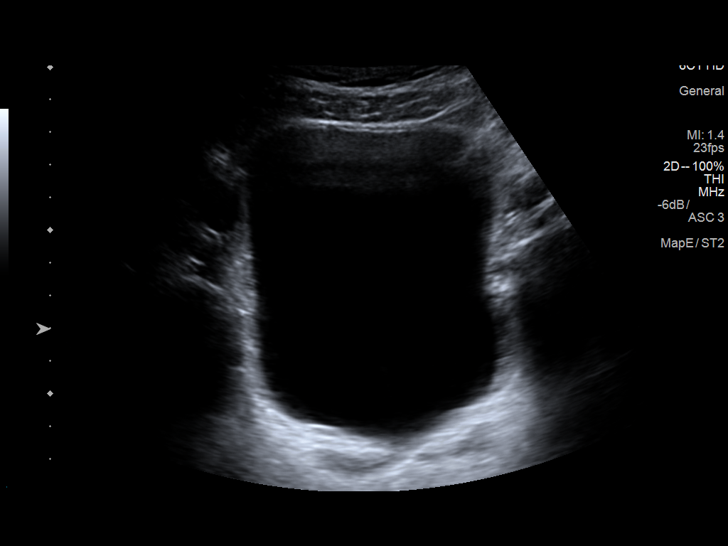
[im 13/21]
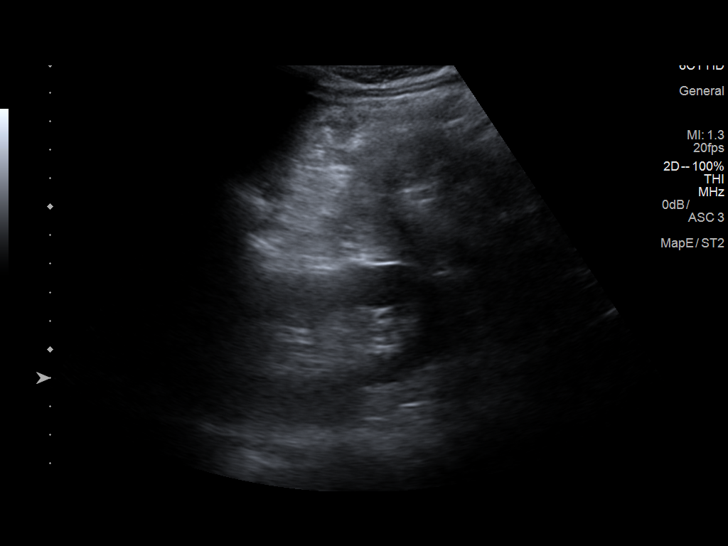
[im 15/21]
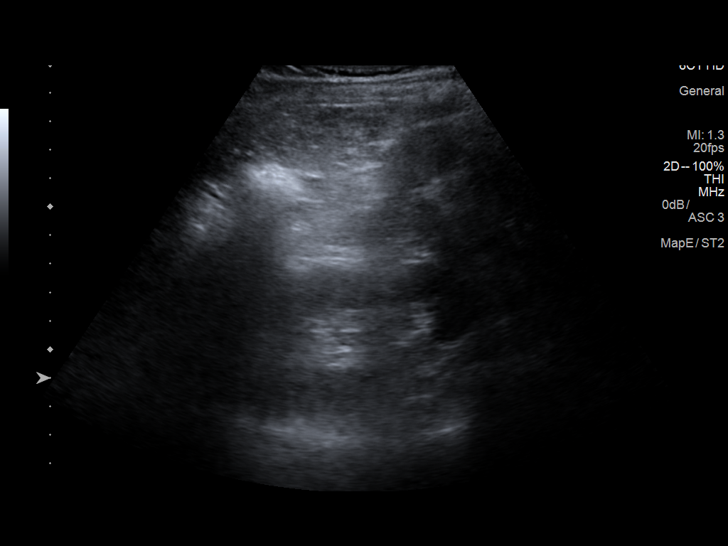
[im 16/21]
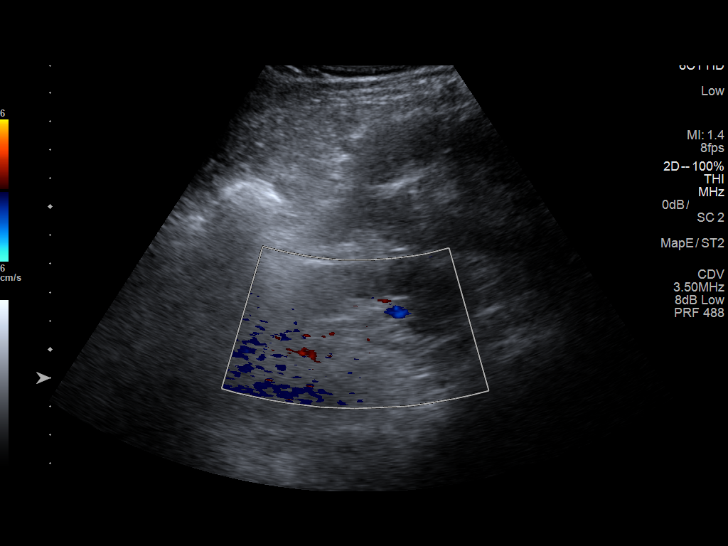
[im 18/21]
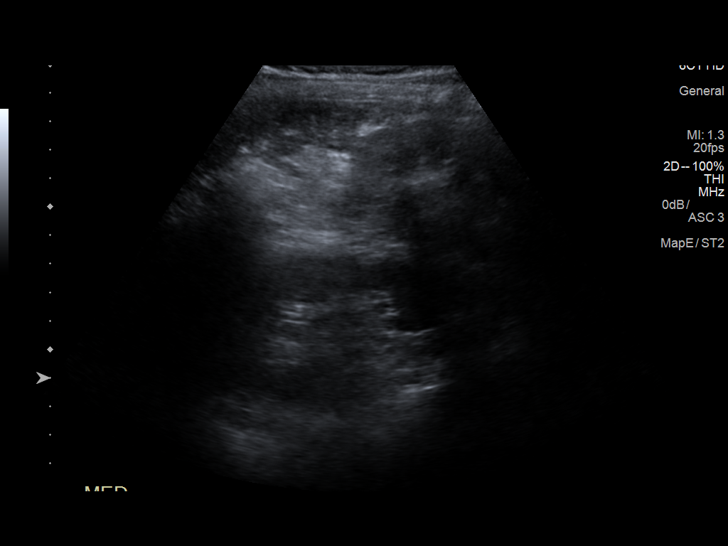
[im 19/21]
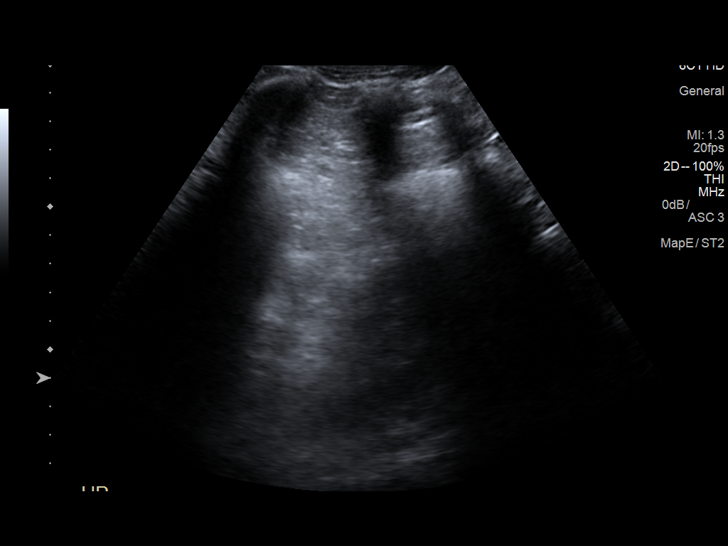
[im 21/21]
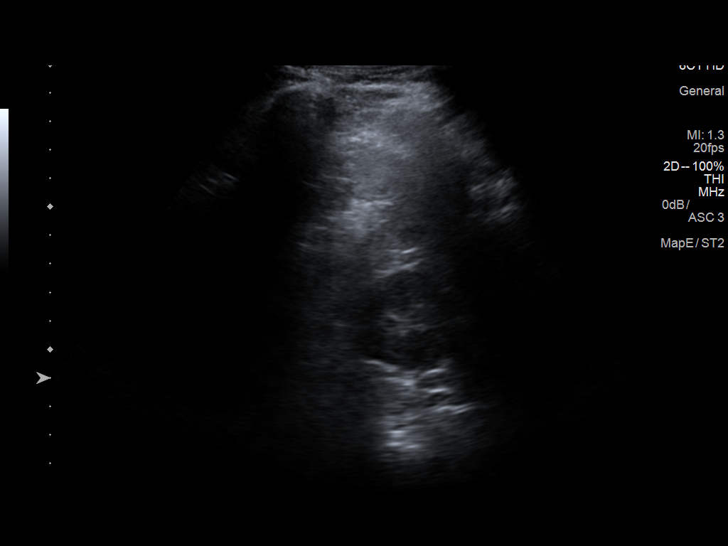

[14 of 21 positions shown; findings below may reference images not displayed]

FINDINGS: Right Kidney:

Length: 11.1 cm. Echogenicity within normal limits. No mass or
hydronephrosis visualized.

Left Kidney:

Length: 10.8 cm. Echogenicity within normal limits. No mass or
hydronephrosis visualized.

Bladder:

Appears normal for degree of bladder distention.
IMPRESSION: No obstructive uropathy. Maintained cortical-medullary distinction
within both kidneys. No sonographic findings for the patient's renal
failure are identified.

## 2018-06-15 ENCOUNTER — Telehealth: Payer: Self-pay | Admitting: Nurse Practitioner

## 2018-06-15 DIAGNOSIS — R030 Elevated blood-pressure reading, without diagnosis of hypertension: Secondary | ICD-10-CM

## 2018-06-15 MED ORDER — LOSARTAN POTASSIUM 50 MG PO TABS: 50.0000 mg | ORAL_TABLET | Freq: Every day | ORAL | 0 refills | Status: DC

## 2018-06-15 MED FILL — LOSARTAN POTASSIUM 50 MG TA: 50 | 30 days supply | Qty: 30 | Fill #0

## 2018-06-15 NOTE — Telephone Encounter (Signed)
He was given a one month refill but will need to make an appointment before any further refills will be approved.

## 2018-06-15 NOTE — Telephone Encounter (Signed)
1) Medication(s) Requested (by name): losartan (COZAAR) 50 MG tablet [[id number]   2) Pharmacy of Choice: [email or web address]   3) Special Requests:   Approved medications will be sent to the pharmacy, we will reach out if there is an issue.  Requests made after 3pm may not be addressed until the following business day!  If a patient is unsure of the name of the medication(s) please note and ask patient to call back when they are able to provide all info, do not send to responsible party until all information is available!

## 2018-06-16 ENCOUNTER — Other Ambulatory Visit: Payer: Self-pay | Admitting: Nurse Practitioner

## 2018-06-16 MED FILL — TRUE METRIX TEST STRIP: 30 days supply | Qty: 100 | Fill #6

## 2018-06-17 ENCOUNTER — Other Ambulatory Visit: Payer: Self-pay | Admitting: Pharmacist

## 2018-06-17 MED ORDER — INSULIN NPH ISOPHANE & REGULAR (70-30) 100 UNIT/ML ~~LOC~~ SUSP: 27.0000 [IU] | Freq: Two times a day (BID) | SUBCUTANEOUS | 0 refills | Status: DC

## 2018-06-18 MED FILL — ?HUMULIN 70/30 VIAL: (70-30) 100 | 18 days supply | Qty: 10 | Fill #0

## 2018-06-29 LAB — GLUCOSE, POCT (MANUAL RESULT ENTRY): POC Glucose: 102 mg/dl — AB (ref 70–99)

## 2018-07-16 ENCOUNTER — Other Ambulatory Visit: Payer: Self-pay | Admitting: Family Medicine

## 2018-07-21 ENCOUNTER — Other Ambulatory Visit: Payer: Self-pay

## 2018-07-21 MED ORDER — INSULIN NPH ISOPHANE & REGULAR (70-30) 100 UNIT/ML ~~LOC~~ SUSP: 27.0000 [IU] | Freq: Two times a day (BID) | SUBCUTANEOUS | 0 refills | Status: DC

## 2018-07-22 MED FILL — ?HUMULIN 70/30 VIAL: (70-30) 100 | 18 days supply | Qty: 10 | Fill #0

## 2018-07-26 ENCOUNTER — Ambulatory Visit: Payer: Self-pay | Attending: Nurse Practitioner | Admitting: Nurse Practitioner

## 2018-07-26 ENCOUNTER — Encounter: Payer: Self-pay | Admitting: Nurse Practitioner

## 2018-07-26 VITALS — BP 131/85 | HR 72 | Temp 97.9°F | Ht 71.0 in | Wt 210.8 lb

## 2018-07-26 DIAGNOSIS — Z760 Encounter for issue of repeat prescription: Secondary | ICD-10-CM | POA: Insufficient documentation

## 2018-07-26 DIAGNOSIS — E1165 Type 2 diabetes mellitus with hyperglycemia: Secondary | ICD-10-CM | POA: Insufficient documentation

## 2018-07-26 DIAGNOSIS — Z8249 Family history of ischemic heart disease and other diseases of the circulatory system: Secondary | ICD-10-CM | POA: Insufficient documentation

## 2018-07-26 DIAGNOSIS — Z794 Long term (current) use of insulin: Secondary | ICD-10-CM | POA: Insufficient documentation

## 2018-07-26 DIAGNOSIS — Z79899 Other long term (current) drug therapy: Secondary | ICD-10-CM | POA: Insufficient documentation

## 2018-07-26 DIAGNOSIS — I10 Essential (primary) hypertension: Secondary | ICD-10-CM | POA: Insufficient documentation

## 2018-07-26 DIAGNOSIS — Z833 Family history of diabetes mellitus: Secondary | ICD-10-CM | POA: Insufficient documentation

## 2018-07-26 LAB — POCT GLYCOSYLATED HEMOGLOBIN (HGB A1C): HEMOGLOBIN A1C: 5.6 % (ref 4.0–5.6)

## 2018-07-26 LAB — GLUCOSE, POCT (MANUAL RESULT ENTRY): POC Glucose: 84 mg/dl (ref 70–99)

## 2018-07-26 MED ORDER — METFORMIN HCL 500 MG PO TABS: 500.0000 mg | ORAL_TABLET | Freq: Two times a day (BID) | ORAL | 3 refills | Status: DC

## 2018-07-26 MED ORDER — LOSARTAN POTASSIUM 50 MG PO TABS: 50.0000 mg | ORAL_TABLET | Freq: Every day | ORAL | 1 refills | Status: DC

## 2018-07-26 MED ORDER — GLUCOSE BLOOD VI STRP: ORAL_STRIP | 12 refills | Status: DC

## 2018-07-26 MED FILL — LOSARTAN POTASSIUM 50 MG TA: 50 | 30 days supply | Qty: 30 | Fill #0

## 2018-07-26 MED FILL — ?METFORMIN HCL 500MG TABS: 500 | 30 days supply | Qty: 60 | Fill #0

## 2018-07-26 MED FILL — TRUE METRIX TEST STRIP: 25 days supply | Qty: 100 | Fill #0

## 2018-07-26 NOTE — Progress Notes (Signed)
Assessment & Plan:  Jesse Weiss was seen today for medication refill.  Diagnoses and all orders for this visit:  Type 2 diabetes mellitus with hyperglycemia, unspecified whether long term insulin use (HCC) -     Glucose (CBG) -     HgB A1c -     metFORMIN (GLUCOPHAGE) 500 MG tablet; Take 1 tablet (500 mg total) by mouth 2 (two) times daily with a meal. -     glucose blood (TRUE METRIX BLOOD GLUCOSE TEST) test strip; Use as instructed Continue blood sugar control as discussed in office today, low carbohydrate diet, and regular physical exercise as tolerated, 150 minutes per week (30 min each day, 5 days per week, or 50 min 3 days per week). Keep blood sugar logs with fasting goal of 90-130 mg/dl, post prandial (after you eat) less than 180.  For Hypoglycemia: BS <60 and Hyperglycemia BS >400; contact the clinic ASAP. Annual eye exams and foot exams are recommended.  Essential hypertension -     losartan (COZAAR) 50 MG tablet; Take 1 tablet (50 mg total) by mouth daily. Continue all antihypertensives as prescribed.  Remember to bring in your blood pressure log with you for your follow up appointment.  DASH/Mediterranean Diets are healthier choices for HTN.      Patient has been counseled on age-appropriate routine health concerns for screening and prevention. These are reviewed and up-to-date. Referrals have been placed accordingly. Immunizations are up-to-date or declined.    Subjective:   Chief Complaint  Patient presents with  . Medication Refill    Pt. is here for inuslin refills. Pt. been out for two days.    HPI Jesse Weiss 52 y.o. male presents to office today for follow up to HTN and DM.   DM Type 2 Well controlled. A1c down to 5.6!!!! Reports being out of his insulin for 2 days however blood glucose level is in the 80s today even after his report of eating a bag of chips today however he did ride his bicycle this morning prior to his appointment.  He is exercising daily and  has lost weight. Monitoring his blood glucose levels twice per day. Denies any hypo or hyperglycemic symptoms. Will stop insulin and start him on metformin. He will return in a few weeks with his meter and log to evaluate his levels on metformin only.   Essential Hypertension Blood pressure is well controlled. Taking losartan 18m daily as prescribed. Denies chest pain, shortness of breath, palpitations, lightheadedness, dizziness, headaches or BLE edema.  BP Readings from Last 3 Encounters:  07/26/18 131/85  11/10/17 (!) 135/94  10/27/17 (!) 147/96    Review of Systems  Constitutional: Negative for fever, malaise/fatigue and weight loss.  HENT: Negative.  Negative for nosebleeds.   Eyes: Negative.  Negative for blurred vision, double vision and photophobia.  Respiratory: Negative.  Negative for cough and shortness of breath.   Cardiovascular: Negative.  Negative for chest pain, palpitations and leg swelling.  Gastrointestinal: Negative.  Negative for heartburn, nausea and vomiting.  Musculoskeletal: Negative.  Negative for myalgias.  Neurological: Negative.  Negative for dizziness, focal weakness, seizures and headaches.  Psychiatric/Behavioral: Negative.  Negative for suicidal ideas.    Past Medical History:  Diagnosis Date  . Diabetes mellitus without complication ([facility]     Past Surgical History:  Procedure Laterality Date  . SHOULDER SURGERY      Family History  Problem Relation Age of Onset  . Hypertension Mother   . Diabetes Father  Social History Reviewed with no changes to be made today.   Outpatient Medications Prior to Visit  Medication Sig Dispense Refill  . blood glucose meter kit and supplies Dispense based on patient and insurance preference. Use up to four times daily as directed. (FOR ICD-9 250.00, 250.01). 1 each 0  . glucose blood (TRUE METRIX BLOOD GLUCOSE TEST) test strip Use as instructed 100 each 12  . insulin NPH-regular Human (HUMULIN 70/30)  (70-30) 100 UNIT/ML injection Inject 27 Units into the skin 2 (two) times daily with a meal. 10 mL 0  . Insulin Syringe-Needle U-100 (INSULIN SYRINGE .3CC/31GX5/16") 31G X 5/16" 0.3 ML MISC Novolin 70/30 27 units bid 100 each 0  . losartan (COZAAR) 50 MG tablet Take 1 tablet (50 mg total) by mouth daily. 30 tablet 0   No facility-administered medications prior to visit.     No Known Allergies     Objective:    BP 131/85 (BP Location: Right Arm, Patient Position: Sitting, Cuff Size: Large)   Pulse 72   Temp 97.9 F (36.6 C) (Oral)   Ht 5' 11"  (1.803 m)   Wt 210 lb 12.8 oz (95.6 kg)   SpO2 98%   BMI 29.40 kg/m  Wt Readings from Last 3 Encounters:  07/26/18 210 lb 12.8 oz (95.6 kg)  10/13/17 216 lb 9.6 oz (98.2 kg)  07/13/17 203 lb 9.6 oz (92.4 kg)    Physical Exam  Constitutional: He is oriented to person, place, and time. He appears well-developed and well-nourished. He is cooperative.  HENT:  Head: Normocephalic and atraumatic.  Eyes: EOM are normal.  Neck: Normal range of motion.  Cardiovascular: Normal rate, regular rhythm, normal heart sounds and intact distal pulses. Exam reveals no gallop and no friction rub.  No murmur heard. Pulmonary/Chest: Effort normal and breath sounds normal. No tachypnea. No respiratory distress. He has no decreased breath sounds. He has no wheezes. He has no rhonchi. He has no rales. He exhibits no tenderness.  Abdominal: Soft. Bowel sounds are normal.  Musculoskeletal: Normal range of motion. He exhibits no edema.  Neurological: He is alert and oriented to person, place, and time. Coordination normal.  Skin: Skin is warm and dry.  Psychiatric: He has a normal mood and affect. His behavior is normal. Judgment and thought content normal.  Nursing note and vitals reviewed.      Patient has been counseled extensively about nutrition and exercise as well as the importance of adherence with medications and regular follow-up. The patient was given  clear instructions to go to ER or return to medical center if symptoms don't improve, worsen or new problems develop. The patient verbalized understanding.   Follow-up: Return in about 6 weeks (around 09/03/2018) for bring meter and log  and CMP.   [name], FNP-BC [facility] and [place], [place]   07/26/2018, 1:30 PM

## 2018-09-09 MED FILL — ?METFORMIN HCL 500MG TABLET: 500 | 30 days supply | Qty: 60 | Fill #1

## 2018-09-09 MED FILL — LOSARTAN POTASSIUM 50 MG TA: 50 | 30 days supply | Qty: 30 | Fill #1

## 2018-10-04 MED FILL — ?METFORMIN HCL 500MG TABLET: 500 | 30 days supply | Qty: 60 | Fill #2

## 2018-10-04 MED FILL — LOSARTAN POTASSIUM 50 MG TA: 50 | 30 days supply | Qty: 30 | Fill #2

## 2018-10-12 ENCOUNTER — Ambulatory Visit: Payer: Self-pay | Attending: Nurse Practitioner | Admitting: Nurse Practitioner

## 2018-10-12 ENCOUNTER — Encounter: Payer: Self-pay | Admitting: Nurse Practitioner

## 2018-10-12 VITALS — BP 138/91 | HR 74 | Temp 98.2°F | Ht 71.0 in | Wt 212.0 lb

## 2018-10-12 DIAGNOSIS — Z8249 Family history of ischemic heart disease and other diseases of the circulatory system: Secondary | ICD-10-CM | POA: Insufficient documentation

## 2018-10-12 DIAGNOSIS — Z1211 Encounter for screening for malignant neoplasm of colon: Secondary | ICD-10-CM | POA: Insufficient documentation

## 2018-10-12 DIAGNOSIS — E1165 Type 2 diabetes mellitus with hyperglycemia: Secondary | ICD-10-CM | POA: Insufficient documentation

## 2018-10-12 DIAGNOSIS — I10 Essential (primary) hypertension: Secondary | ICD-10-CM | POA: Insufficient documentation

## 2018-10-12 DIAGNOSIS — Z7984 Long term (current) use of oral hypoglycemic drugs: Secondary | ICD-10-CM | POA: Insufficient documentation

## 2018-10-12 DIAGNOSIS — Z79899 Other long term (current) drug therapy: Secondary | ICD-10-CM | POA: Insufficient documentation

## 2018-10-12 LAB — GLUCOSE, POCT (MANUAL RESULT ENTRY): POC Glucose: 114 mg/dl — AB (ref 70–99)

## 2018-10-12 MED ORDER — METFORMIN HCL 500 MG PO TABS: 500.0000 mg | ORAL_TABLET | Freq: Two times a day (BID) | ORAL | 1 refills | Status: DC

## 2018-10-12 NOTE — Progress Notes (Signed)
Assessment & Plan:  Jesse Weiss was seen today for follow-up.  Diagnoses and all orders for this visit:  Type 2 diabetes mellitus with hyperglycemia, unspecified whether long term insulin use (HCC) -     Glucose (CBG) -     metFORMIN (GLUCOPHAGE) 500 MG tablet; Take 1 tablet (500 mg total) by mouth 2 (two) times daily with a meal. Controlled Continue medications as prescribed.  Continue blood sugar control as discussed in office today, low carbohydrate diet, and regular physical exercise as tolerated, 150 minutes per week (30 min each day, 5 days per week, or 50 min 3 days per week). Keep blood sugar logs with fasting goal of 90-130 mg/dl, post prandial (after you eat) less than 180.  For Hypoglycemia: BS <60 and Hyperglycemia BS >400; contact the clinic ASAP. Annual eye exams and foot exams are recommended.   Essential hypertension -     CMP14+EGFR Continue all antihypertensives as prescribed.  Remember to bring in your blood pressure log with you for your follow up appointment.  DASH/Mediterranean Diets are healthier choices for HTN.    Colon cancer screening -     Fecal occult blood, imunochemical([facility])    Patient has been counseled on age-appropriate routine health concerns for screening and prevention. These are reviewed and up-to-date. Referrals have been placed accordingly. Immunizations are up-to-date or declined.    Subjective:   Chief Complaint  Patient presents with  . Follow-up    Pt. is here to follow-up on diabetes. Pt. brought his glucose meter in today.    HPI Jesse Weiss 53 y.o. male presents to office today for DM follow up and HTN.  DM TYPE 2 Lab Results  Component Value Date   HGBA1C 5.6 07/26/2018  Stable and well controlled. Currently taking metformin 500 mg BID. He does endorses loose stools. Insulin was stopped in November due to hypoglycemia. He has his meter with him today. Average readings: 7 day averages 166 14 day average 134 30 day  average 131 He denies any hypo or hyperglycemic symptoms. Overdue for eye exam. Patient has been advised to apply for financial assistance and schedule to see our financial counselor.    CHRONIC HYPERTENSION Disease Monitoring  Blood pressure range BP Readings from Last 3 Encounters:  10/12/18 (!) 138/91  07/26/18 131/85  06/29/18 (!) 136/92   He does not monitor his blood pressure at home. However blood pressure is well controlled.   Chest pain: no   Dyspnea: no   Claudication: no  Medication compliance: yes, taking losartan 64m daily  Medication Side Effects  Lightheadedness: no   Urinary frequency: no   Edema: no   Impotence: no  Preventitive Healthcare:  Exercise: no   Diet Pattern: diet: general  Salt Restriction:  no  Review of Systems  Constitutional: Negative for fever, malaise/fatigue and weight loss.  HENT: Negative.  Negative for nosebleeds.   Eyes: Negative.  Negative for blurred vision, double vision and photophobia.  Respiratory: Negative.  Negative for cough and shortness of breath.   Cardiovascular: Negative.  Negative for chest pain, palpitations and leg swelling.  Gastrointestinal: Negative.  Negative for heartburn, nausea and vomiting.  Musculoskeletal: Negative.  Negative for myalgias.  Neurological: Negative.  Negative for dizziness, focal weakness, seizures and headaches.  Psychiatric/Behavioral: Negative.  Negative for suicidal ideas.    Past Medical History:  Diagnosis Date  . [place] mellitus without complication ([place]   . Hypertension     Past Surgical History:  Procedure Laterality Date  .  SHOULDER SURGERY      Family History  Problem Relation Age of Onset  . Hypertension Mother   . Diabetes Father     Social History Reviewed with no changes to be made today.   Outpatient Medications Prior to Visit  Medication Sig Dispense Refill  . blood glucose meter kit and supplies Dispense based on patient and insurance preference. Use up to  four times daily as directed. (FOR ICD-9 250.00, 250.01). 1 each 0  . glucose blood (TRUE METRIX BLOOD GLUCOSE TEST) test strip Use as instructed 100 each 12  . losartan (COZAAR) 50 MG tablet Take 1 tablet (50 mg total) by mouth daily. 90 tablet 1  . metFORMIN (GLUCOPHAGE) 500 MG tablet Take 1 tablet (500 mg total) by mouth 2 (two) times daily with a meal. 60 tablet 3   No facility-administered medications prior to visit.     No Known Allergies     Objective:    BP (!) 138/91 (BP Location: Right Arm, Patient Position: Sitting, Cuff Size: Large)   Pulse 74   Temp 98.2 F (36.8 C) (Oral)   Ht 5' 11"  (1.803 m)   Wt 212 lb (96.2 kg)   SpO2 95%   BMI 29.57 kg/m  Wt Readings from Last 3 Encounters:  10/12/18 212 lb (96.2 kg)  07/26/18 210 lb 12.8 oz (95.6 kg)  10/13/17 216 lb 9.6 oz (98.2 kg)    Physical Exam Vitals signs and nursing note reviewed.  Constitutional:      Appearance: He is well-developed.  HENT:     Head: Normocephalic and atraumatic.  Neck:     Musculoskeletal: Normal range of motion.  Cardiovascular:     Rate and Rhythm: Normal rate and regular rhythm.     Heart sounds: Normal heart sounds. No murmur. No friction rub. No gallop.   Pulmonary:     Effort: Pulmonary effort is normal. No tachypnea or respiratory distress.     Breath sounds: Normal breath sounds. No decreased breath sounds, wheezing, rhonchi or rales.  Chest:     Chest wall: No tenderness.  Abdominal:     General: Bowel sounds are normal.     Palpations: Abdomen is soft.  Musculoskeletal: Normal range of motion.  Skin:    General: Skin is warm and dry.  Neurological:     Mental Status: He is alert and oriented to person, place, and time.     Coordination: Coordination normal.  Psychiatric:        Behavior: Behavior normal. Behavior is cooperative.        Thought Content: Thought content normal.        Judgment: Judgment normal.          Patient has been counseled extensively about  nutrition and exercise as well as the importance of adherence with medications and regular follow-up. The patient was given clear instructions to go to ER or return to medical center if symptoms don't improve, worsen or new problems develop. The patient verbalized understanding.   Follow-up: Return in about 3 months (around 01/10/2019) for fasting labs/DM.   [name], FNP-BC [facility] and [place], [place]   10/12/2018, 3:00 PM

## 2018-10-12 NOTE — Patient Instructions (Signed)
Carbohydrate Counting for Diabetes Mellitus, Adult  Carbohydrate counting is a method of keeping track of how many carbohydrates you eat. Eating carbohydrates naturally increases the amount of sugar (glucose) in the blood. Counting how many carbohydrates you eat helps keep your blood glucose within normal limits, which helps you manage your diabetes (diabetes mellitus). It is important to know how many carbohydrates you can safely have in each meal. This is different for every person. A diet and nutrition specialist (registered dietitian) can help you make a meal plan and calculate how many carbohydrates you should have at each meal and snack. Carbohydrates are found in the following foods:  Grains, such as breads and cereals.  Dried beans and soy products.  Starchy vegetables, such as potatoes, peas, and corn.  Fruit and fruit juices.  Milk and yogurt.  Sweets and snack foods, such as cake, cookies, candy, chips, and soft drinks. How do I count carbohydrates? There are two ways to count carbohydrates in food. You can use either of the methods or a combination of both. Reading "Nutrition Facts" on packaged food The "Nutrition Facts" list is included on the labels of almost all packaged foods and beverages in the [place] It includes:  The serving size.  Information about nutrients in each serving, including the grams (g) of carbohydrate per serving. To use the "Nutrition Facts":  Decide how many servings you will have.  Multiply the number of servings by the number of carbohydrates per serving.  The resulting number is the total amount of carbohydrates that you will be having. Learning standard serving sizes of other foods When you eat carbohydrate foods that are not packaged or do not include "Nutrition Facts" on the label, you need to measure the servings in order to count the amount of carbohydrates:  Measure the foods that you will eat with a food scale or measuring cup, if needed.   Decide how many standard-size servings you will eat.  Multiply the number of servings by 15. Most carbohydrate-rich foods have about 15 g of carbohydrates per serving. ? For example, if you eat 8 oz (170 g) of strawberries, you will have eaten 2 servings and 30 g of carbohydrates (2 servings x 15 g = 30 g).  For foods that have more than one food mixed, such as soups and casseroles, you must count the carbohydrates in each food that is included. The following list contains standard serving sizes of common carbohydrate-rich foods. Each of these servings has about 15 g of carbohydrates:   hamburger bun or  English muffin.   oz (15 mL) syrup.   oz (14 g) jelly.  1 slice of bread.  1 six-inch tortilla.  3 oz (85 g) cooked rice or pasta.  4 oz (113 g) cooked dried beans.  4 oz (113 g) starchy vegetable, such as peas, corn, or potatoes.  4 oz (113 g) hot cereal.  4 oz (113 g) mashed potatoes or  of a large baked potato.  4 oz (113 g) canned or frozen fruit.  4 oz (120 mL) fruit juice.  4-6 crackers.  6 chicken nuggets.  6 oz (170 g) unsweetened dry cereal.  6 oz (170 g) plain fat-free yogurt or yogurt sweetened with artificial sweeteners.  8 oz (240 mL) milk.  8 oz (170 g) fresh fruit or one small piece of fruit.  24 oz (680 g) popped popcorn. Example of carbohydrate counting Sample meal  3 oz (85 g) chicken breast.  6 oz (170 g)   brown rice.  4 oz (113 g) corn.  8 oz (240 mL) milk.  8 oz (170 g) strawberries with sugar-free whipped topping. Carbohydrate calculation 1. Identify the foods that contain carbohydrates: ? Rice. ? Corn. ? Milk. ? Strawberries. 2. Calculate how many servings you have of each food: ? 2 servings rice. ? 1 serving corn. ? 1 serving milk. ? 1 serving strawberries. 3. Multiply each number of servings by 15 g: ? 2 servings rice x 15 g = 30 g. ? 1 serving corn x 15 g = 15 g. ? 1 serving milk x 15 g = 15 g. ? 1 serving  strawberries x 15 g = 15 g. 4. Add together all of the amounts to find the total grams of carbohydrates eaten: ? 30 g + 15 g + 15 g + 15 g = 75 g of carbohydrates total. Summary  Carbohydrate counting is a method of keeping track of how many carbohydrates you eat.  Eating carbohydrates naturally increases the amount of sugar (glucose) in the blood.  Counting how many carbohydrates you eat helps keep your blood glucose within normal limits, which helps you manage your diabetes.  A diet and nutrition specialist (registered dietitian) can help you make a meal plan and calculate how many carbohydrates you should have at each meal and snack. This information is not intended to replace advice given to you by your health care provider. Make sure you discuss any questions you have with your health care provider. Document Released: 08/18/2005 Document Revised: 02/25/2017 Document Reviewed: 01/30/2016 Elsevier Interactive Patient Education  2019 Elsevier Inc.  

## 2018-10-13 LAB — CMP14+EGFR
A/G RATIO: 1.5 (ref 1.2–2.2)
ALK PHOS: 54 IU/L (ref 39–117)
ALT: 14 IU/L (ref 0–44)
AST: 15 IU/L (ref 0–40)
Albumin: 4 g/dL (ref 3.8–4.9)
BILIRUBIN TOTAL: 0.2 mg/dL (ref 0.0–1.2)
BUN/Creatinine Ratio: 12 (ref 9–20)
BUN: 12 mg/dL (ref 6–24)
CHLORIDE: 108 mmol/L — AB (ref 96–106)
CO2: 19 mmol/L — ABNORMAL LOW (ref 20–29)
Calcium: 9.2 mg/dL (ref 8.7–10.2)
Creatinine, Ser: 0.99 mg/dL (ref 0.76–1.27)
GFR calc non Af Amer: 87 mL/min/{1.73_m2} (ref >59)
GFR, EST AFRICAN AMERICAN: 101 mL/min/{1.73_m2} (ref >59)
Globulin, Total: 2.7 g/dL (ref 1.5–4.5)
Glucose: 98 mg/dL (ref 65–99)
POTASSIUM: 4.5 mmol/L (ref 3.5–5.2)
Sodium: 142 mmol/L (ref 134–144)
TOTAL PROTEIN: 6.7 g/dL (ref 6.0–8.5)

## 2018-10-18 ENCOUNTER — Telehealth: Payer: Self-pay

## 2018-10-18 NOTE — Telephone Encounter (Signed)
CMA spoke to patient to inform on results.  Pt. Verified DOB. Pt. Understood. Pt.stated he already send out his Fecal kit.  

## 2018-10-18 NOTE — Telephone Encounter (Signed)
-----   Message from Claiborne Rigg, NP sent at 10/17/2018 12:04 AM EST ----- Kidney and liver function are normal. Please dont forget to send in your stool sample

## 2018-10-20 LAB — FECAL OCCULT BLOOD, IMMUNOCHEMICAL: FECAL OCCULT BLD: NEGATIVE

## 2018-10-21 ENCOUNTER — Telehealth: Payer: Self-pay

## 2018-10-21 NOTE — Telephone Encounter (Signed)
Patient called back and was inform on results.   Pt. Understood.

## 2018-10-21 NOTE — Telephone Encounter (Signed)
CMA attempt to reach patient to inform on results.  No answer and unable to leave a VM due to mailbox is full.  

## 2018-10-21 NOTE — Telephone Encounter (Signed)
-----   Message from Claiborne Rigg, NP sent at 10/20/2018 10:45 PM EST ----- Stool test is negative for blood

## 2018-12-09 MED FILL — ?METFORMIN HCL 500MG TABLET: 500 | 30 days supply | Qty: 60 | Fill #3

## 2018-12-09 MED FILL — LOSARTAN POTASSIUM 50 MG TA: 50 | 30 days supply | Qty: 30 | Fill #3

## 2019-01-11 ENCOUNTER — Other Ambulatory Visit: Payer: Self-pay

## 2019-01-11 ENCOUNTER — Ambulatory Visit: Payer: Self-pay | Attending: Nurse Practitioner | Admitting: Nurse Practitioner

## 2019-01-11 ENCOUNTER — Encounter: Payer: Self-pay | Admitting: Nurse Practitioner

## 2019-01-11 DIAGNOSIS — E118 Type 2 diabetes mellitus with unspecified complications: Secondary | ICD-10-CM

## 2019-01-11 DIAGNOSIS — I10 Essential (primary) hypertension: Secondary | ICD-10-CM

## 2019-01-11 MED ORDER — METFORMIN HCL 500 MG PO TABS: 500.0000 mg | ORAL_TABLET | Freq: Two times a day (BID) | ORAL | 1 refills | Status: DC

## 2019-01-11 MED ORDER — LOSARTAN POTASSIUM 50 MG PO TABS: 50.0000 mg | ORAL_TABLET | Freq: Every day | ORAL | 1 refills | Status: DC

## 2019-01-11 MED FILL — LOSARTAN POTASSIUM 50 MG TA: 50 | 30 days supply | Qty: 30 | Fill #0

## 2019-01-11 MED FILL — ?METFORMIN HCL 500MG TABLET: 500 | 90 days supply | Qty: 180 | Fill #0

## 2019-01-11 NOTE — Progress Notes (Signed)
Virtual Visit via Telephone Note Due to national recommendations of social distancing due to [place] 19, telehealth visit is felt to be most appropriate for this patient at this time.  I discussed the limitations, risks, security and privacy concerns of performing an evaluation and management service by telephone and the availability of in person appointments. I also discussed with the patient that there may be a patient responsible charge related to this service. The patient expressed understanding and agreed to proceed.    I connected with Jesse Weiss on 01/11/19  at  11:10 AM EDT  EDT by telephone and verified that I am speaking with the correct person using two identifiers.   Consent I discussed the limitations, risks, security and privacy concerns of performing an evaluation and management service by telephone and the availability of in person appointments. I also discussed with the patient that there may be a patient responsible charge related to this service. The patient expressed understanding and agreed to proceed.   Location of Patient: Private Residence   Location of Provider: [place] [facility] [place] participating in Telemedicine visit: Geryl Rankins FNP-BC [place]    History of Present Illness: Telemedicine visit for: Follow up to DM  DM TYPE 2 Monitoring his blood glucose levels 1-2 times per day. Fasting 100s and Post prandial 150-160s. Denies any hypo or hyperglycemic symptoms. Taking metformin 564m BID as prescribed. Overdue for eye exam.  Lab Results  Component Value Date   HGBA1C 5.6 07/26/2018    Essential Hypertension He does not monitor his blood pressure at home. Taking Losartan 50 mg as prescribed. Denies chest pain, shortness of breath, palpitations, lightheadedness, dizziness, headaches or BLE edema. Labs pending.  BP Readings from Last 3 Encounters:  10/12/18 (!) 138/91  07/26/18 131/85  06/29/18 (!) 136/92      Past Medical History:  Diagnosis Date  . Diabetes mellitus without complication ([place]   . Hypertension     Past Surgical History:  Procedure Laterality Date  . SHOULDER SURGERY      Family History  Problem Relation Age of Onset  . Hypertension Mother   . Diabetes Father     Social History   Socioeconomic History  . Marital status: Single    Spouse name: Not on file  . Number of children: Not on file  . Years of education: Not on file  . Highest education level: Not on file  Occupational History  . Not on file  Social Needs  . Financial resource strain: Not on file  . Food insecurity:    Worry: Not on file    Inability: Not on file  . Transportation needs:    Medical: Not on file    Non-medical: Not on file  Tobacco Use  . Smoking status: Never Smoker  . Smokeless tobacco: Never Used  Substance and Sexual Activity  . Alcohol use: No  . Drug use: No  . Sexual activity: Not on file  Lifestyle  . Physical activity:    Days per week: Not on file    Minutes per session: Not on file  . Stress: Not on file  Relationships  . Social connections:    Talks on phone: Not on file    Gets together: Not on file    Attends religious service: Not on file    Active member of club or organization: Not on file    Attends meetings of clubs or organizations: Not on file  Relationship status: Not on file  Other Topics Concern  . Not on file  Social History Narrative  . Not on file     Observations/Objective: Awake, alert and oriented x 3   Review of Systems  Constitutional: Negative for fever, malaise/fatigue and weight loss.  HENT: Negative.  Negative for nosebleeds.   Eyes: Negative.  Negative for blurred vision, double vision and photophobia.  Respiratory: Negative.  Negative for cough and shortness of breath.   Cardiovascular: Negative.  Negative for chest pain, palpitations and leg swelling.  Gastrointestinal: Negative.  Negative for heartburn, nausea and  vomiting.  Musculoskeletal: Negative.  Negative for myalgias.  Neurological: Negative.  Negative for dizziness, focal weakness, seizures and headaches.  Psychiatric/Behavioral: Negative.  Negative for suicidal ideas.    Assessment and Plan:  Diagnoses and all orders for this visit:  Controlled type 2 diabetes mellitus with complication, without long-term current use of insulin ([place]) -     Lipid panel; Future -     Hemoglobin A1c; Future -     CBC; Future -     metFORMIN (GLUCOPHAGE) 500 MG tablet; Take 1 tablet (500 mg total) by mouth 2 (two) times daily with a meal. Controlled Continue medications as prescribed.  Continue blood sugar control as discussed in office today, low carbohydrate diet, and regular physical exercise as tolerated, 150 minutes per week (30 min each day, 5 days per week, or 50 min 3 days per week). Keep blood sugar logs with fasting goal of 90-130 mg/dl, post prandial (after you eat) less than 180.  For Hypoglycemia: BS <60 and Hyperglycemia BS >400; contact the clinic ASAP. Annual eye exams and foot exams are recommended.  Essential hypertension -     losartan (COZAAR) 50 MG tablet; Take 1 tablet (50 mg total) by mouth daily. -     CMP14+EGFR; Future Continue all antihypertensives as prescribed.  Remember to bring in your blood pressure log with you for your follow up appointment.  DASH/Mediterranean Diets are healthier choices for HTN.     Follow Up Instructions Return in about 3 months (around 04/13/2019).     I discussed the assessment and treatment plan with the patient. The patient was provided an opportunity to ask questions and all were answered. The patient agreed with the plan and demonstrated an understanding of the instructions.   The patient was advised to call back or seek an in-person evaluation if the symptoms worsen or if the condition fails to improve as anticipated.  I provided 16 minutes of non-face-to-face time during this encounter  including median intraservice time, reviewing previous notes, labs, imaging, medications and explaining diagnosis and management.  [name], FNP-BC

## 2019-01-20 ENCOUNTER — Other Ambulatory Visit: Payer: Self-pay

## 2019-01-20 ENCOUNTER — Ambulatory Visit: Payer: Self-pay | Attending: Family Medicine

## 2019-01-20 DIAGNOSIS — E118 Type 2 diabetes mellitus with unspecified complications: Secondary | ICD-10-CM

## 2019-01-20 DIAGNOSIS — I10 Essential (primary) hypertension: Secondary | ICD-10-CM

## 2019-01-21 LAB — CBC
Hematocrit: 44.5 % (ref 37.5–51.0)
Hemoglobin: 15.5 g/dL (ref 13.0–17.7)
MCH: 30.6 pg (ref 26.6–33.0)
MCHC: 34.8 g/dL (ref 31.5–35.7)
MCV: 88 fL (ref 79–97)
Platelets: 319 10*3/uL (ref 150–450)
RBC: 5.07 x10E6/uL (ref 4.14–5.80)
RDW: 13.1 % (ref 11.6–15.4)
WBC: 8.1 10*3/uL (ref 3.4–10.8)

## 2019-01-21 LAB — CMP14+EGFR
ALT: 30 IU/L (ref 0–44)
AST: 21 IU/L (ref 0–40)
Albumin/Globulin Ratio: 1.5 (ref 1.2–2.2)
Albumin: 4.3 g/dL (ref 3.8–4.9)
Alkaline Phosphatase: 62 IU/L (ref 39–117)
BUN/Creatinine Ratio: 9 (ref 9–20)
BUN: 9 mg/dL (ref 6–24)
Bilirubin Total: 0.4 mg/dL (ref 0.0–1.2)
CO2: 22 mmol/L (ref 20–29)
Calcium: 9.6 mg/dL (ref 8.7–10.2)
Chloride: 100 mmol/L (ref 96–106)
Creatinine, Ser: 0.95 mg/dL (ref 0.76–1.27)
GFR calc Af Amer: 106 mL/min/{1.73_m2} (ref >59)
GFR calc non Af Amer: 92 mL/min/{1.73_m2} (ref >59)
Globulin, Total: 2.8 g/dL (ref 1.5–4.5)
Glucose: 109 mg/dL — ABNORMAL HIGH (ref 65–99)
Potassium: 4.1 mmol/L (ref 3.5–5.2)
Sodium: 139 mmol/L (ref 134–144)
Total Protein: 7.1 g/dL (ref 6.0–8.5)

## 2019-01-21 LAB — LIPID PANEL
Chol/HDL Ratio: 3.7 ratio (ref 0.0–5.0)
Cholesterol, Total: 160 mg/dL (ref 100–199)
HDL: 43 mg/dL (ref >39)
LDL Calculated: 99 mg/dL (ref 0–99)
Triglycerides: 90 mg/dL (ref 0–149)
VLDL Cholesterol Cal: 18 mg/dL (ref 5–40)

## 2019-01-21 LAB — HEMOGLOBIN A1C
Est. average glucose Bld gHb Est-mCnc: 157 mg/dL
Hgb A1c MFr Bld: 7.1 % — ABNORMAL HIGH (ref 4.8–5.6)

## 2019-01-24 ENCOUNTER — Other Ambulatory Visit: Payer: Self-pay | Admitting: Nurse Practitioner

## 2019-01-24 MED ORDER — ATORVASTATIN CALCIUM 20 MG PO TABS: 20.0000 mg | ORAL_TABLET | Freq: Every day | ORAL | 3 refills | Status: DC

## 2019-01-25 MED FILL — ?ATORVASTATIN 20 MG TABLET: 20 | 30 days supply | Qty: 30 | Fill #0

## 2019-01-26 ENCOUNTER — Telehealth: Payer: Self-pay | Admitting: Emergency Medicine

## 2019-01-26 NOTE — Telephone Encounter (Signed)
Patient contacted via phone to be given results of labs.  Patient identified by name and date of birth.  Patient given results of labs.  Patient educated on lab results. Questions answered. Patient acknowledged understanding of labs results. 

## 2019-02-17 MED FILL — LOSARTAN POTASSIUM 50 MG TA: 50 | 30 days supply | Qty: 30 | Fill #1

## 2019-04-18 MED FILL — ?METFORMIN HCL 500MG TABLET: 500 | 90 days supply | Qty: 180 | Fill #1

## 2019-04-18 MED FILL — ?ATORVASTATIN 20 MG TABLET: 20 | 30 days supply | Qty: 30 | Fill #1

## 2019-04-18 MED FILL — LOSARTAN POTASSIUM 50 MG TA: 50 | 30 days supply | Qty: 30 | Fill #2

## 2019-06-08 MED FILL — ATORVASTATIN CALCIUM 20 MG: 20 | 30 days supply | Qty: 30 | Fill #2

## 2019-06-08 MED FILL — LOSARTAN POTASSIUM 50 MG TA: 50 | 30 days supply | Qty: 30 | Fill #3

## 2019-07-05 MED FILL — LOSARTAN POTASSIUM 50 MG TA: 50 | 30 days supply | Qty: 30 | Fill #3

## 2019-07-06 MED FILL — ?METFORMIN HCL 500MG TABLET: 500 | 30 days supply | Qty: 60 | Fill #0

## 2019-07-14 ENCOUNTER — Ambulatory Visit: Payer: Self-pay

## 2019-09-07 MED FILL — ?METFORMIN HCL 500MG TABLET: 500 | 30 days supply | Qty: 60 | Fill #1

## 2019-09-07 MED FILL — LOSARTAN POTASSIUM 50 MG TA: 50 | 30 days supply | Qty: 30 | Fill #4

## 2019-09-07 MED FILL — ?ATORVASTATIN 20 MG TABLET: 20 | 30 days supply | Qty: 30 | Fill #2

## 2019-10-12 ENCOUNTER — Other Ambulatory Visit: Payer: Self-pay | Admitting: Nurse Practitioner

## 2019-10-12 DIAGNOSIS — E118 Type 2 diabetes mellitus with unspecified complications: Secondary | ICD-10-CM

## 2019-10-12 MED FILL — LOSARTAN POTASSIUM 50 MG TA: 50 | 30 days supply | Qty: 30 | Fill #5

## 2019-10-12 MED FILL — ?ATORVASTATIN 20 MG TABLET: 20 | 30 days supply | Qty: 30 | Fill #3

## 2019-10-14 ENCOUNTER — Other Ambulatory Visit: Payer: Self-pay | Admitting: Nurse Practitioner

## 2019-10-14 DIAGNOSIS — E118 Type 2 diabetes mellitus with unspecified complications: Secondary | ICD-10-CM

## 2019-10-24 ENCOUNTER — Other Ambulatory Visit: Payer: Self-pay

## 2019-10-24 ENCOUNTER — Ambulatory Visit: Payer: Self-pay | Attending: Internal Medicine

## 2019-10-24 DIAGNOSIS — Z20822 Contact with and (suspected) exposure to covid-19: Secondary | ICD-10-CM | POA: Insufficient documentation

## 2019-10-25 LAB — NOVEL CORONAVIRUS, NAA: SARS-CoV-2, NAA: NOT DETECTED

## 2019-10-28 ENCOUNTER — Telehealth: Payer: Self-pay

## 2019-10-28 NOTE — Telephone Encounter (Signed)
Patient called and he was informed that his COVID-19 tst done 10/24/19 was negative. He is not infected with the Novel Coronavirus.  He verbalized understanding and then requested copy for employer. He was give [employer]	[place] 1 800 number. He is unable to use MY Chart  He states his phone does not have enough memory for download.

## 2019-10-31 ENCOUNTER — Ambulatory Visit: Payer: Self-pay | Admitting: Nurse Practitioner

## 2019-10-31 ENCOUNTER — Other Ambulatory Visit: Payer: Self-pay | Admitting: Pharmacist

## 2019-10-31 DIAGNOSIS — E118 Type 2 diabetes mellitus with unspecified complications: Secondary | ICD-10-CM

## 2019-10-31 MED ORDER — METFORMIN HCL 500 MG PO TABS: 500.0000 mg | ORAL_TABLET | Freq: Two times a day (BID) | ORAL | 0 refills | Status: DC

## 2019-10-31 MED FILL — ?METFORMIN HCL 500MG TABLET: 500 | 30 days supply | Qty: 60 | Fill #0

## 2019-11-01 ENCOUNTER — Encounter: Payer: Self-pay | Admitting: Nurse Practitioner

## 2019-11-01 ENCOUNTER — Ambulatory Visit: Payer: Self-pay | Attending: Nurse Practitioner | Admitting: Nurse Practitioner

## 2019-11-01 ENCOUNTER — Other Ambulatory Visit: Payer: Self-pay

## 2019-11-01 VITALS — BP 150/87 | HR 73 | Temp 96.6°F | Ht 71.0 in | Wt 203.0 lb

## 2019-11-01 DIAGNOSIS — E1165 Type 2 diabetes mellitus with hyperglycemia: Secondary | ICD-10-CM

## 2019-11-01 DIAGNOSIS — Z1211 Encounter for screening for malignant neoplasm of colon: Secondary | ICD-10-CM

## 2019-11-01 DIAGNOSIS — E118 Type 2 diabetes mellitus with unspecified complications: Secondary | ICD-10-CM

## 2019-11-01 DIAGNOSIS — I10 Essential (primary) hypertension: Secondary | ICD-10-CM

## 2019-11-01 DIAGNOSIS — E785 Hyperlipidemia, unspecified: Secondary | ICD-10-CM

## 2019-11-01 LAB — POCT GLYCOSYLATED HEMOGLOBIN (HGB A1C): Hemoglobin A1C: 7.6 % — AB (ref 4.0–5.6)

## 2019-11-01 LAB — GLUCOSE, POCT (MANUAL RESULT ENTRY): POC Glucose: 222 mg/dl — AB (ref 70–99)

## 2019-11-01 MED ORDER — LOSARTAN POTASSIUM 50 MG PO TABS: 50.0000 mg | ORAL_TABLET | Freq: Every day | ORAL | 1 refills | Status: DC

## 2019-11-01 MED ORDER — ATORVASTATIN CALCIUM 20 MG PO TABS: 20.0000 mg | ORAL_TABLET | Freq: Every day | ORAL | 3 refills | Status: DC

## 2019-11-01 MED ORDER — GLIPIZIDE 5 MG PO TABS: 2.5000 mg | ORAL_TABLET | Freq: Two times a day (BID) | ORAL | 3 refills | Status: DC

## 2019-11-01 MED ORDER — TRUEPLUS LANCETS 28G MISC: 3 refills | Status: DC

## 2019-11-01 MED ORDER — TRUE METRIX BLOOD GLUCOSE TEST VI STRP: ORAL_STRIP | 12 refills | Status: DC

## 2019-11-01 MED ORDER — METFORMIN HCL 500 MG PO TABS: 500.0000 mg | ORAL_TABLET | Freq: Two times a day (BID) | ORAL | 6 refills | Status: DC

## 2019-11-01 MED FILL — TRUEplus LANCETS 28G MISC: 50 days supply | Qty: 100 | Fill #0

## 2019-11-01 MED FILL — TRUE METRIX TEST STRIP: 50 days supply | Qty: 100 | Fill #0

## 2019-11-01 MED FILL — ?GLIPIZIDE 5MG TABLET: 5 | 30 days supply | Qty: 30 | Fill #0

## 2019-11-01 NOTE — Progress Notes (Signed)
Assessment & Plan:  Quan was seen today for medication refill.  Diagnoses and all orders for this visit:  Type 2 diabetes mellitus with hyperglycemia, unspecified whether long term insulin use ([facility]) -     Microalbumin/Creatinine Ratio, Urine -     Glucose (CBG) -     HgB A1c -     metFORMIN (GLUCOPHAGE) 500 MG tablet; Take 1 tablet (500 mg total) by mouth 2 (two) times daily with a meal. -     glucose blood (TRUE METRIX BLOOD GLUCOSE TEST) test strip; Use as instructed. Check blood glucose level by fingerstick twice per day.  E11.65 -     TRUEplus Lancets 28G MISC; Use as instructed. Check blood glucose level by fingerstick twice per day.  E11.65 -     glipiZIDE (GLUCOTROL) 5 MG tablet; Take 0.5 tablets (2.5 mg total) by mouth 2 (two) times daily before a meal. -     CMP14+EGFR -     CBC -     Ambulatory referral to Ophthalmology Continue blood sugar control as discussed in office today, low carbohydrate diet, and regular physical exercise as tolerated, 150 minutes per week (30 min each day, 5 days per week, or 50 min 3 days per week). Keep blood sugar logs with fasting goal of 90-130 mg/dl, post prandial (after you eat) less than 180.  For Hypoglycemia: BS <60 and Hyperglycemia BS >400; contact the clinic ASAP. Annual eye exams and foot exams are recommended.   Dyslipidemia, goal LDL below 70 -     atorvastatin (LIPITOR) 20 MG tablet; Take 1 tablet (20 mg total) by mouth daily. -     Lipid panel INSTRUCTIONS: Work on a low fat, heart healthy diet and participate in regular aerobic exercise program by working out at least 150 minutes per week; 5 days a week-30 minutes per day. Avoid red meat/beef/steak,  fried foods. junk foods, sodas, sugary drinks, unhealthy snacking, alcohol and smoking.  Drink at least 80 oz of water per day and monitor your carbohydrate intake daily.    Essential hypertension -     losartan (COZAAR) 50 MG tablet; Take 1 tablet (50 mg total) by mouth  daily. Continue all antihypertensives as prescribed.  Remember to bring in your blood pressure log with you for your follow up appointment.  DASH/Mediterranean Diets are healthier choices for HTN.    Colon cancer screening -     Fecal occult blood, imunochemical([facility])     Patient has been counseled on age-appropriate routine health concerns for screening and prevention. These are reviewed and up-to-date. Referrals have been placed accordingly. Immunizations are up-to-date or declined.    Subjective:   Chief Complaint  Patient presents with  . Medication Refill    Pt. is here for medication refill.    HPI Jesse Weiss 54 y.o. male presents to office today for follow up.  DM TYPE 2 Not well controlled. Had a bag of chips for breakfast this morning. Not well controlled. He is not dietary adherent. States he plans to start exercising again. Currently taking metformin 500 mg BID. Will add glipizide 2.5 mg BID today as he states increasing metformin will cause increased GI upset. He is overdue for eye exam. Will refer to ophthalmology. He denies any symptoms of hypo or hyperglycemia. FOOT EXAM performed today.  Lab Results  Component Value Date   HGBA1C 7.6 (A) 11/01/2019   Lab Results  Component Value Date   HGBA1C 7.1 (H) 01/20/2019    Essential  Hypertension Blood pressure is elevated. Does not monitor at home. Not taking Losartan 50 mg every day as prescribed. Denies chest pain, shortness of breath, palpitations, lightheadedness, dizziness, headaches or BLE edema. Will have him return in 2 weeks for repeat bp check. May need to increase losartan.  BP Readings from Last 3 Encounters:  11/01/19 (!) 150/87  10/12/18 (!) 138/91  07/26/18 131/85   Hyperlipidemia Patient presents for follow up to hyperlipidemia.  He is medication compliant taking atorvastatin 20 mg daily. He is not diet compliant and denies  statin intolerance including myalgias.  Lab Results  Component  Value Date   CHOL 160 01/20/2019   Lab Results  Component Value Date   HDL 43 01/20/2019   Lab Results  Component Value Date   LDLCALC 99 01/20/2019   Lab Results  Component Value Date   TRIG 90 01/20/2019   Lab Results  Component Value Date   CHOLHDL 3.7 01/20/2019    Review of Systems  Constitutional: Negative for fever, malaise/fatigue and weight loss.  HENT: Negative.  Negative for nosebleeds.   Eyes: Negative.  Negative for blurred vision, double vision and photophobia.  Respiratory: Negative.  Negative for cough and shortness of breath.   Cardiovascular: Negative.  Negative for chest pain, palpitations and leg swelling.  Gastrointestinal: Negative.  Negative for heartburn, nausea and vomiting.  Musculoskeletal: Negative.  Negative for myalgias.  Neurological: Negative.  Negative for dizziness, focal weakness, seizures and headaches.  Psychiatric/Behavioral: Negative.  Negative for suicidal ideas.    Past Medical History:  Diagnosis Date  . Diabetes mellitus without complication ([place])   . Hypertension     Past Surgical History:  Procedure Laterality Date  . SHOULDER SURGERY      Family History  Problem Relation Age of Onset  . Hypertension Mother   . Diabetes Father     Social History Reviewed with no changes to be made today.   Outpatient Medications Prior to Visit  Medication Sig Dispense Refill  . blood glucose meter kit and supplies Dispense based on patient and insurance preference. Use up to four times daily as directed. (FOR ICD-9 250.00, 250.01). 1 each 0  . atorvastatin (LIPITOR) 20 MG tablet Take 1 tablet (20 mg total) by mouth daily. 90 tablet 3  . glucose blood (TRUE METRIX BLOOD GLUCOSE TEST) test strip Use as instructed 100 each 12  . losartan (COZAAR) 50 MG tablet Take 1 tablet (50 mg total) by mouth daily. 90 tablet 1  . metFORMIN (GLUCOPHAGE) 500 MG tablet Take 1 tablet (500 mg total) by mouth 2 (two) times daily with a meal. 60 tablet 0    No facility-administered medications prior to visit.    No Known Allergies     Objective:    BP (!) 150/87 (BP Location: Right Arm, Patient Position: Sitting, Cuff Size: Large)   Pulse 73   Temp (!) 96.6 F (35.9 C) (Temporal)   Ht 5' 11"  (1.803 m)   Wt 203 lb (92.1 kg)   SpO2 99%   BMI 28.31 kg/m  Wt Readings from Last 3 Encounters:  11/01/19 203 lb (92.1 kg)  10/12/18 212 lb (96.2 kg)  07/26/18 210 lb 12.8 oz (95.6 kg)    Physical Exam Vitals and nursing note reviewed.  Constitutional:      Appearance: He is well-developed.  HENT:     Head: Normocephalic and atraumatic.  Cardiovascular:     Rate and Rhythm: Normal rate and regular rhythm.  Pulses:          Dorsalis pedis pulses are 2+ on the right side and 2+ on the left side.       Posterior tibial pulses are 2+ on the right side and 2+ on the left side.     Heart sounds: Normal heart sounds. No murmur. No friction rub. No gallop.   Pulmonary:     Effort: Pulmonary effort is normal. No tachypnea or respiratory distress.     Breath sounds: Normal breath sounds. No decreased breath sounds, wheezing, rhonchi or rales.  Chest:     Chest wall: No tenderness.  Abdominal:     General: Bowel sounds are normal.     Palpations: Abdomen is soft.  Musculoskeletal:        General: Normal range of motion.     Cervical back: Normal range of motion.     Right foot: No deformity.     Left foot: No deformity.  Feet:     Right foot:     Protective Sensation: 10 sites tested. 10 sites sensed.     Skin integrity: No callus or dry skin.     Toenail Condition: Fungal disease present.    Left foot:     Protective Sensation: 10 sites tested. 10 sites sensed.     Skin integrity: No callus or dry skin.     Toenail Condition: Fungal disease present. Skin:    General: Skin is warm and dry.  Neurological:     Mental Status: He is alert and oriented to person, place, and time.     Coordination: Coordination normal.   Psychiatric:        Behavior: Behavior normal. Behavior is cooperative.        Thought Content: Thought content normal.        Judgment: Judgment normal.          Patient has been counseled extensively about nutrition and exercise as well as the importance of adherence with medications and regular follow-up. The patient was given clear instructions to go to ER or return to medical center if symptoms don't improve, worsen or new problems develop. The patient verbalized understanding.   Follow-up: Return in about 2 weeks (around 11/15/2019) for Blood pressure check with [facility].   [name], FNP-BC [facility] [place], [place]   11/01/2019, 1:56 PM

## 2019-11-02 LAB — CMP14+EGFR
ALT: 51 IU/L — ABNORMAL HIGH (ref 0–44)
AST: 26 IU/L (ref 0–40)
Albumin/Globulin Ratio: 1.3 (ref 1.2–2.2)
Albumin: 4.1 g/dL (ref 3.8–4.9)
Alkaline Phosphatase: 63 IU/L (ref 39–117)
BUN/Creatinine Ratio: 13 (ref 9–20)
BUN: 11 mg/dL (ref 6–24)
Bilirubin Total: 0.3 mg/dL (ref 0.0–1.2)
CO2: 17 mmol/L — ABNORMAL LOW (ref 20–29)
Calcium: 9.3 mg/dL (ref 8.7–10.2)
Chloride: 107 mmol/L — ABNORMAL HIGH (ref 96–106)
Creatinine, Ser: 0.84 mg/dL (ref 0.76–1.27)
GFR calc Af Amer: 116 mL/min/{1.73_m2} (ref >59)
GFR calc non Af Amer: 100 mL/min/{1.73_m2} (ref >59)
Globulin, Total: 3.1 g/dL (ref 1.5–4.5)
Sodium: 144 mmol/L (ref 134–144)
Total Protein: 7.2 g/dL (ref 6.0–8.5)

## 2019-11-02 LAB — MICROALBUMIN / CREATININE URINE RATIO
Creatinine, Urine: 98.6 mg/dL
Microalb/Creat Ratio: 5 mg/g creat (ref 0–29)
Microalbumin, Urine: 5.4 ug/mL

## 2019-11-02 LAB — CBC
Hematocrit: 45.4 % (ref 37.5–51.0)
Hemoglobin: 15.2 g/dL (ref 13.0–17.7)
MCH: 30.6 pg (ref 26.6–33.0)
MCHC: 33.5 g/dL (ref 31.5–35.7)
MCV: 91 fL (ref 79–97)
Platelets: 270 10*3/uL (ref 150–450)
RBC: 4.97 x10E6/uL (ref 4.14–5.80)
RDW: 12.7 % (ref 11.6–15.4)
WBC: 6.7 10*3/uL (ref 3.4–10.8)

## 2019-11-02 LAB — LIPID PANEL
Chol/HDL Ratio: 4 ratio (ref 0.0–5.0)
Cholesterol, Total: 156 mg/dL (ref 100–199)
HDL: 39 mg/dL — ABNORMAL LOW (ref >39)
LDL Chol Calc (NIH): 95 mg/dL (ref 0–99)
Triglycerides: 124 mg/dL (ref 0–149)
VLDL Cholesterol Cal: 22 mg/dL (ref 5–40)

## 2019-11-13 LAB — FECAL OCCULT BLOOD, IMMUNOCHEMICAL: Fecal Occult Bld: NEGATIVE

## 2019-11-15 ENCOUNTER — Other Ambulatory Visit: Payer: Self-pay

## 2019-11-15 ENCOUNTER — Ambulatory Visit: Payer: Self-pay | Attending: Nurse Practitioner | Admitting: Pharmacist

## 2019-11-15 VITALS — BP 131/84 | HR 77

## 2019-11-15 DIAGNOSIS — I10 Essential (primary) hypertension: Secondary | ICD-10-CM

## 2019-11-15 NOTE — Patient Instructions (Signed)
Thank you for coming to see us today.   Blood pressure today is improving.  Continue taking blood pressure medications as prescribed.   Limiting salt and caffeine, as well as exercising as able for at least 30 minutes for 5 days out of the week, can also help you lower your blood pressure.  Take your blood pressure at home if you are able. Please write down these numbers and bring them to your visits.  If you have any questions about medications, please call me [phone].  Jesse Weiss  

## 2019-11-15 NOTE — Progress Notes (Signed)
   S:    PCP: Jesse Weiss   Patient arrives in good spirits.    Presents to the clinic for hypertension evaluation, counseling, and management.  Patient was referred and last seen by Primary Care Provider on 11/01/19. BP was elevated, but he reports being without medication prior to that visit for some time.   Patient denies chest pain, dyspnea, HA or blurred vision.   Patient reports adherence with medications. Resumed these about ~1 week ago.  Current BP Medications include:  Losartan 50 mg daily  Dietary habits include: does not limit salt; reports drinking coffee throughout the day  Exercise habits include: helps with father around the house but nothing outside of this Family / Social history:  - Fhx: DM, HTN - Tobacco: never smoker - Alcohol: denies use  O:  Vitals:   11/15/19 1538  BP: 131/84  Pulse: 77    Home BP readings: did not bring any; occasionally checks at [facility] - reports SBPs in the 140s  Last 3 Office BP readings: BP Readings from Last 3 Encounters:  11/15/19 131/84  11/01/19 (!) 150/87  10/12/18 (!) 138/91   BMET    Component Value Date/Time   NA 144 11/01/2019 1158   K CANCELED 11/01/2019 1158   CL 107 (H) 11/01/2019 1158   CO2 17 (L) 11/01/2019 1158   GLUCOSE CANCELED 11/01/2019 1158   GLUCOSE 264 (H) 06/01/2017 0617   BUN 11 11/01/2019 1158   CREATININE 0.84 11/01/2019 1158   CALCIUM 9.3 11/01/2019 1158   GFRNONAA 100 11/01/2019 1158   GFRAA 116 11/01/2019 1158   Renal function: CrCl cannot be calculated (Unknown ideal weight.).  Clinical ASCVD: No  The 10-year ASCVD risk score Denman George DC Jr., et al., 2013) is: 19.1%   Values used to calculate the score:     Age: 71 years     Sex: Male     Is Non-Hispanic African American: Yes     Diabetic: Yes     Tobacco smoker: No     Systolic Blood Pressure: 131 mmHg     Is BP treated: Yes     HDL Cholesterol: 39 mg/dL     Total Cholesterol: 156 mg/dL  A/P: Hypertension longstanding currently close  to goal on current medications. BP Goal = <130/80 mmHg. Patient endorses med compliance. He just resumed his losartan last week; no changes today. Patient encouraged to continue with medication compliance.  -Continued current medications.  -Counseled on lifestyle modifications for blood pressure control including reduced dietary sodium, increased exercise, adequate sleep  Results reviewed and written information provided.   Total time in face-to-face counseling 15 minutes.   F/U Clinic Visit with PCP.  Butch Penny, PharmD, CPP Clinical Pharmacist [facility] [phone]

## 2019-11-23 MED FILL — ?METFORMIN HCL 500MG TABLET: 500 | 30 days supply | Qty: 60 | Fill #0

## 2019-11-23 MED FILL — ?ATORVASTATIN 20 MG TABLET: 20 | 30 days supply | Qty: 30 | Fill #4

## 2019-11-23 MED FILL — LOSARTAN POTASSIUM 50 MG TA: 50 | 30 days supply | Qty: 30 | Fill #0

## 2019-12-14 MED FILL — ?GLIPIZIDE 5MG TABLET: 5 | 30 days supply | Qty: 30 | Fill #1

## 2019-12-21 MED FILL — LOSARTAN POTASSIUM 50 MG TA: 50 | 30 days supply | Qty: 30 | Fill #1

## 2019-12-21 MED FILL — ?ATORVASTATIN 20 MG TABLET: 20 | 30 days supply | Qty: 30 | Fill #5

## 2019-12-21 MED FILL — metFORMIN HCL 500 MG TABS: 500 | 30 days supply | Qty: 60 | Fill #1

## 2020-01-16 MED FILL — LOSARTAN POTASSIUM 50 MG TA: 50 | 30 days supply | Qty: 30 | Fill #2

## 2020-01-16 MED FILL — ?GLIPIZIDE 5MG TABLET: 5 | 30 days supply | Qty: 30 | Fill #2

## 2020-01-16 MED FILL — ?ATORVASTATIN 20 MG TABLET: 20 | 30 days supply | Qty: 30 | Fill #6

## 2020-01-16 MED FILL — METFORMIN HCL 500 MG TABS: 500 | 30 days supply | Qty: 60 | Fill #2

## 2020-02-20 MED FILL — LOSARTAN POTASSIUM 50 MG TA: 50 | 30 days supply | Qty: 30 | Fill #3

## 2020-02-20 MED FILL — METFORMIN HCL 500 MG TABS: 500 | 30 days supply | Qty: 60 | Fill #3

## 2020-02-20 MED FILL — ?ATORVASTATIN 20 MG TABLET: 20 | 30 days supply | Qty: 30 | Fill #0

## 2020-03-19 MED FILL — METFORMIN HCL 500 MG TABS: 500 | 30 days supply | Qty: 60 | Fill #4

## 2020-03-19 MED FILL — LOSARTAN POTASSIUM 50 MG TA: 50 | 30 days supply | Qty: 30 | Fill #4

## 2020-03-19 MED FILL — ?ATORVASTATIN 20 MG TABLET: 20 | 30 days supply | Qty: 30 | Fill #1

## 2020-04-20 MED FILL — ?METFORMIN HCL 500MG TABL: 500 | 30 days supply | Qty: 60 | Fill #5

## 2020-04-20 MED FILL — LOSARTAN POTASSIUM 50 MG TA: 50 | 30 days supply | Qty: 30 | Fill #5

## 2020-04-20 MED FILL — ?ATORVASTATIN 20 MG TABLET: 20 | 30 days supply | Qty: 30 | Fill #2

## 2020-05-17 ENCOUNTER — Other Ambulatory Visit: Payer: Self-pay

## 2020-05-18 ENCOUNTER — Other Ambulatory Visit: Payer: Self-pay | Admitting: Nurse Practitioner

## 2020-05-18 DIAGNOSIS — I10 Essential (primary) hypertension: Secondary | ICD-10-CM

## 2020-05-18 MED FILL — ?ATORVASTATIN 20 MG TABLET: 20 | 30 days supply | Qty: 30 | Fill #3

## 2020-05-18 MED FILL — LOSARTAN POTASSIUM 50 MG TA: 50 | 30 days supply | Qty: 30 | Fill #0

## 2020-06-15 ENCOUNTER — Other Ambulatory Visit: Payer: Self-pay | Admitting: Nurse Practitioner

## 2020-06-15 DIAGNOSIS — I10 Essential (primary) hypertension: Secondary | ICD-10-CM

## 2020-06-15 MED FILL — ?ATORVASTATIN 20 MG TABLET: 20 | 30 days supply | Qty: 30 | Fill #4

## 2020-06-15 NOTE — Telephone Encounter (Signed)
Requested medication (s) are due for refill today: yes  Requested medication (s) are on the active medication list: yes  Last refill:  05/18/20  #30  0 refills  Future visit scheduled: yes called and scheduled with provider 07/25/20  Notes to clinic:  Patient has scheduled appointment. Labs are overdue or never checked    Requested Prescriptions  Pending Prescriptions Disp Refills   losartan (COZAAR) 50 MG tablet [Pharmacy Med Name: LOSARTAN POTASSIUM 50 MG TA 50 Tablet] 30 tablet 0    Sig: TAKE 1 TABLET (50 MG TOTAL) BY MOUTH DAILY.      Cardiovascular:  Angiotensin Receptor Blockers Failed - 06/15/2020 10:07 AM      Failed - Cr in normal range and within 180 days    Creatinine, Ser  Date Value Ref Range Status  11/01/2019 0.84 0.76 - 1.27 mg/dL Final          Failed - K in normal range and within 180 days    Potassium  Date Value Ref Range Status  11/01/2019 CANCELED mmol/L     Comment:    Test not performed.  Serum was in contact with cells when received which will make the result inaccurate.  Result canceled by the ancillary.           Failed - Valid encounter within last 6 months    Recent Outpatient Visits           7 months ago Essential hypertension   [place] [facility] [place], Jeannett Senior L, RPH-CPP   7 months ago Type 2 diabetes mellitus with hyperglycemia, unspecified whether long term insulin use [facility])   [place] [facility] [place], [place] W, NP   1 year ago Controlled type 2 diabetes mellitus with complication, without long-term current use of insulin [facility])   [place] [facility] [place], [place] W, NP   1 year ago Type 2 diabetes mellitus with hyperglycemia, unspecified whether long term insulin use [facility])   [place] [facility] [place], [place] W, NP   1 year ago Type 2 diabetes mellitus with hyperglycemia, unspecified whether long term insulin use [facility])   [place]  [facility] Claiborne Rigg, NP       Future Appointments             In 1 month Claiborne Rigg, NP [facility]            Passed - Patient is not pregnant      Passed - Last BP in normal range    BP Readings from Last 1 Encounters:  11/15/19 131/84

## 2020-06-21 ENCOUNTER — Other Ambulatory Visit: Payer: Self-pay | Admitting: Nurse Practitioner

## 2020-06-21 DIAGNOSIS — I10 Essential (primary) hypertension: Secondary | ICD-10-CM

## 2020-06-21 MED FILL — ?LOSARTAN POTASSIUM 50 MG T: 50 | 30 days supply | Qty: 30 | Fill #0

## 2020-06-21 NOTE — Telephone Encounter (Signed)
Transmission to pharmacy failed on 06/15/20. Resent

## 2020-07-20 ENCOUNTER — Other Ambulatory Visit: Payer: Self-pay | Admitting: Family Medicine

## 2020-07-20 DIAGNOSIS — I10 Essential (primary) hypertension: Secondary | ICD-10-CM

## 2020-07-20 MED FILL — METFORMIN HCL 500 MG TABS: 500 | 30 days supply | Qty: 60 | Fill #6

## 2020-07-20 MED FILL — ?ATORVASTATIN 20 MG TABLET: 20 | 30 days supply | Qty: 30 | Fill #5

## 2020-07-20 MED FILL — ?LOSARTAN POTASSIUM 50 MG T: 50 | 10 days supply | Qty: 10 | Fill #0

## 2020-07-25 ENCOUNTER — Other Ambulatory Visit: Payer: Self-pay

## 2020-07-25 ENCOUNTER — Ambulatory Visit: Payer: Self-pay | Attending: Nurse Practitioner | Admitting: Nurse Practitioner

## 2020-07-25 ENCOUNTER — Other Ambulatory Visit: Payer: Self-pay | Admitting: Nurse Practitioner

## 2020-07-25 ENCOUNTER — Encounter: Payer: Self-pay | Admitting: Nurse Practitioner

## 2020-07-25 VITALS — BP 129/79 | HR 72 | Temp 97.3°F | Ht 71.0 in | Wt 207.0 lb

## 2020-07-25 DIAGNOSIS — E785 Hyperlipidemia, unspecified: Secondary | ICD-10-CM

## 2020-07-25 DIAGNOSIS — Z1159 Encounter for screening for other viral diseases: Secondary | ICD-10-CM

## 2020-07-25 DIAGNOSIS — E1165 Type 2 diabetes mellitus with hyperglycemia: Secondary | ICD-10-CM

## 2020-07-25 DIAGNOSIS — I10 Essential (primary) hypertension: Secondary | ICD-10-CM

## 2020-07-25 LAB — POCT GLYCOSYLATED HEMOGLOBIN (HGB A1C): Hemoglobin A1C: 7.7 % — AB (ref 4.0–5.6)

## 2020-07-25 LAB — GLUCOSE, POCT (MANUAL RESULT ENTRY): POC Glucose: 194 mg/dl — AB (ref 70–99)

## 2020-07-25 MED ORDER — TRUE METRIX METER W/DEVICE KIT: PACK | 0 refills | Status: DC

## 2020-07-25 MED ORDER — LOSARTAN POTASSIUM 50 MG PO TABS: 50.0000 mg | ORAL_TABLET | Freq: Every day | ORAL | 0 refills | Status: DC

## 2020-07-25 MED ORDER — BLOOD GLUCOSE METER KIT: PACK | 0 refills | Status: AC

## 2020-07-25 MED ORDER — METFORMIN HCL 500 MG PO TABS: 500.0000 mg | ORAL_TABLET | Freq: Two times a day (BID) | ORAL | 6 refills | Status: DC

## 2020-07-25 MED ORDER — TRUEPLUS LANCETS 28G MISC: 3 refills | Status: DC

## 2020-07-25 MED ORDER — TRUE METRIX BLOOD GLUCOSE TEST VI STRP: ORAL_STRIP | 12 refills | Status: DC

## 2020-07-25 MED ORDER — TRUE METRIX METER W/DEVICE KIT: PACK | 0 refills | Status: AC

## 2020-07-25 MED ORDER — GLIPIZIDE 5 MG PO TABS: 5.0000 mg | ORAL_TABLET | Freq: Two times a day (BID) | ORAL | 0 refills | Status: DC

## 2020-07-25 MED ORDER — ATORVASTATIN CALCIUM 20 MG PO TABS: 20.0000 mg | ORAL_TABLET | Freq: Every day | ORAL | 3 refills | Status: DC

## 2020-07-25 MED ORDER — GLIPIZIDE 5 MG PO TABS: 2.5000 mg | ORAL_TABLET | Freq: Two times a day (BID) | ORAL | 3 refills | Status: DC

## 2020-07-25 MED FILL — ?GLIPIZIDE 5MG TABLET: 5 | 30 days supply | Qty: 60 | Fill #0

## 2020-07-25 MED FILL — !TRUE METRIX BLOOD GLUCOSE: 1 days supply | Qty: 1 | Fill #0

## 2020-07-25 MED FILL — TRUE METRIX TEST STRIP: 30 days supply | Qty: 100 | Fill #0

## 2020-07-25 MED FILL — ?LOSARTAN POTASSIUM 50 MG T: 50 | 10 days supply | Qty: 10 | Fill #0

## 2020-07-25 MED FILL — TRUEplus LANCETS 28G MISC: 30 days supply | Qty: 100 | Fill #0

## 2020-07-25 NOTE — Addendum Note (Signed)
Addended byVidal Schwalbe on: 07/25/2020 02:04 PM   Modules accepted: Orders

## 2020-07-25 NOTE — Progress Notes (Signed)
Assessment & Plan:  Jesse Weiss was seen today for medication refill.  Diagnoses and all orders for this visit:  Type 2 diabetes mellitus with hyperglycemia, without long-term current use of insulin (HCC) -     CMP14+EGFR -     Ambulatory referral to Ophthalmology -     Glucose (CBG) -     HgB A1c -     Discontinue: glipiZIDE (GLUCOTROL) 5 MG tablet; Take 0.5 tablets (2.5 mg total) by mouth 2 (two) times daily before a meal. -     metFORMIN (GLUCOPHAGE) 500 MG tablet; Take 1 tablet (500 mg total) by mouth 2 (two) times daily with a meal. -     TRUEplus Lancets 28G MISC; Use as instructed. Check blood glucose level by fingerstick twice per day.  E11.65 -     glucose blood (TRUE METRIX BLOOD GLUCOSE TEST) test strip; Use as instructed. Check blood glucose level by fingerstick twice per day.  E11.65 -     glipiZIDE (GLUCOTROL) 5 MG tablet; Take 1 tablet (5 mg total) by mouth 2 (two) times daily before a meal. Continue blood sugar control as discussed in office today, low carbohydrate diet, and regular physical exercise as tolerated, 150 minutes per week (30 min each day, 5 days per week, or 50 min 3 days per week). Keep blood sugar logs with fasting goal of 90-130 mg/dl, post prandial (after you eat) less than 180.  For Hypoglycemia: BS <60 and Hyperglycemia BS >400; contact the clinic ASAP. Annual eye exams and foot exams are recommended.  Essential hypertension -     losartan (COZAAR) 50 MG tablet; Take 1 tablet (50 mg total) by mouth daily. Continue all antihypertensives as prescribed.  Remember to bring in your blood pressure log with you for your follow up appointment.  DASH/Mediterranean Diets are healthier choices for HTN.    Dyslipidemia, goal LDL below 70 -     atorvastatin (LIPITOR) 20 MG tablet; Take 1 tablet (20 mg total) by mouth daily. INSTRUCTIONS: Work on a low fat, heart healthy diet and participate in regular aerobic exercise program by working out at least 150 minutes per  week; 5 days a week-30 minutes per day. Avoid red meat/beef/steak,  fried foods. junk foods, sodas, sugary drinks, unhealthy snacking, alcohol and smoking.  Drink at least 80 oz of water per day and monitor your carbohydrate intake daily.   Need for hepatitis C screening test -     HCV Ab w Reflex to Quant PCR    Patient has been counseled on age-appropriate routine health concerns for screening and prevention. These are reviewed and up-to-date. Referrals have been placed accordingly. Immunizations are up-to-date or declined.    Subjective:   Chief Complaint  Patient presents with  . Medication Refill    Pt. is here for medication and diabetes follow up.   HPI Jesse Weiss 54 y.o. male presents to office today for follow up. He has a history of HTN, DM2, HPL  Essential Hypertension Well-controlled.  Currently taking losartan 50 mg daily as prescribed. Denies chest pain, shortness of breath, palpitations, lightheadedness, dizziness, headaches or BLE edema.  He is concerned today regarding his losartan and states that the pill color change and is currently green.  I have instructed him that depending on the manufacturer of the medication sometimes the shape or color may change.  He endorses mild nausea when taking his losartan and I have instructed him to try to take it in a different time aside  from his Metformin and glipizide. BP Readings from Last 3 Encounters:  07/25/20 129/79  11/15/19 131/84  11/01/19 (!) 150/87    DM2 Currently taking glipizide 2.5 mg BID and metformin 500 mg BID. LDL not at goal although he endorses medication compliance taking lipitor 20 mg daily.  Diabetes is not well controlled and I am increasing his glipizide to 5 mg twice a day today.  Poorly controlled diabetes is related to his diet and lack of exercise. Lab Results  Component Value Date   HGBA1C 7.7 (A) 07/25/2020   Lab Results  Component Value Date   HGBA1C 7.6 (A) 11/01/2019   Lab Results    Component Value Date   [place] 95 11/01/2019     Review of Systems  Constitutional: Negative for fever, malaise/fatigue and weight loss.  HENT: Negative.  Negative for nosebleeds.   Eyes: Negative.  Negative for blurred vision, double vision and photophobia.  Respiratory: Negative.  Negative for cough and shortness of breath.   Cardiovascular: Negative.  Negative for chest pain, palpitations and leg swelling.  Gastrointestinal: Negative.  Negative for heartburn, nausea and vomiting.  Musculoskeletal: Negative.  Negative for myalgias.  Neurological: Negative.  Negative for dizziness, focal weakness, seizures and headaches.  Psychiatric/Behavioral: Negative.  Negative for suicidal ideas.    Past Medical History:  Diagnosis Date  . Diabetes mellitus without complication ([place])   . Hypertension     Past Surgical History:  Procedure Laterality Date  . SHOULDER SURGERY      Family History  Problem Relation Age of Onset  . Hypertension Mother   . Diabetes Father     Social History Reviewed with no changes to be made today.   Outpatient Medications Prior to Visit  Medication Sig Dispense Refill  . blood glucose meter kit and supplies Dispense based on patient and insurance preference. Use up to four times daily as directed. (FOR ICD-9 250.00, 250.01). 1 each 0  . atorvastatin (LIPITOR) 20 MG tablet Take 1 tablet (20 mg total) by mouth daily. 90 tablet 3  . glipiZIDE (GLUCOTROL) 5 MG tablet Take 0.5 tablets (2.5 mg total) by mouth 2 (two) times daily before a meal. 30 tablet 3  . glucose blood (TRUE METRIX BLOOD GLUCOSE TEST) test strip Use as instructed. Check blood glucose level by fingerstick twice per day.  E11.65 100 each 12  . losartan (COZAAR) 50 MG tablet TAKE 1 TABLET (50 MG TOTAL) BY MOUTH DAILY. 10 tablet 0  . metFORMIN (GLUCOPHAGE) 500 MG tablet Take 1 tablet (500 mg total) by mouth 2 (two) times daily with a meal. 60 tablet 6  . TRUEplus Lancets 28G MISC Use as  instructed. Check blood glucose level by fingerstick twice per day.  E11.65 100 each 3   No facility-administered medications prior to visit.    No Known Allergies     Objective:    BP 129/79 (BP Location: Left Arm, Patient Position: Sitting, Cuff Size: Normal)   Pulse 72   Temp (!) 97.3 F (36.3 C) (Temporal)   Ht 5' 11"  (1.803 m)   Wt 207 lb (93.9 kg)   SpO2 99%   BMI 28.87 kg/m  Wt Readings from Last 3 Encounters:  07/25/20 207 lb (93.9 kg)  11/01/19 203 lb (92.1 kg)  10/12/18 212 lb (96.2 kg)    Physical Exam Vitals and nursing note reviewed.  Constitutional:      Appearance: He is well-developed.  HENT:     Head: Normocephalic and atraumatic.  Cardiovascular:     Rate and Rhythm: Normal rate and regular rhythm.     Heart sounds: Normal heart sounds. No murmur heard.  No friction rub. No gallop.   Pulmonary:     Effort: Pulmonary effort is normal. No tachypnea or respiratory distress.     Breath sounds: Normal breath sounds. No decreased breath sounds, wheezing, rhonchi or rales.  Chest:     Chest wall: No tenderness.  Abdominal:     General: Bowel sounds are normal.     Palpations: Abdomen is soft.  Musculoskeletal:        General: Normal range of motion.     Cervical back: Normal range of motion.  Skin:    General: Skin is warm and dry.  Neurological:     Mental Status: He is alert and oriented to person, place, and time.     Coordination: Coordination normal.  Psychiatric:        Behavior: Behavior normal. Behavior is cooperative.        Thought Content: Thought content normal.        Judgment: Judgment normal.          Patient has been counseled extensively about nutrition and exercise as well as the importance of adherence with medications and regular follow-up. The patient was given clear instructions to go to ER or return to medical center if symptoms don't improve, worsen or new problems develop. The patient verbalized understanding.    Follow-up: Return in about 3 months (around 10/25/2020).   [name], FNP-BC [facility] and [place], [place]   07/25/2020, 1:59 PM

## 2020-07-26 LAB — HCV AB W REFLEX TO QUANT PCR: HCV Ab: <0.1 s/co ratio (ref 0.0–0.9)

## 2020-07-26 LAB — CMP14+EGFR
ALT: 39 IU/L (ref 0–44)
AST: 22 IU/L (ref 0–40)
Albumin/Globulin Ratio: 1.6 (ref 1.2–2.2)
Albumin: 4.4 g/dL (ref 3.8–4.9)
Alkaline Phosphatase: 65 IU/L (ref 44–121)
BUN/Creatinine Ratio: 8 — ABNORMAL LOW (ref 9–20)
BUN: 8 mg/dL (ref 6–24)
Bilirubin Total: 0.4 mg/dL (ref 0.0–1.2)
CO2: 24 mmol/L (ref 20–29)
Calcium: 9.6 mg/dL (ref 8.7–10.2)
Chloride: 107 mmol/L — ABNORMAL HIGH (ref 96–106)
Creatinine, Ser: 0.96 mg/dL (ref 0.76–1.27)
GFR calc Af Amer: 103 mL/min/{1.73_m2} (ref >59)
GFR calc non Af Amer: 89 mL/min/{1.73_m2} (ref >59)
Globulin, Total: 2.8 g/dL (ref 1.5–4.5)
Glucose: 176 mg/dL — ABNORMAL HIGH (ref 65–99)
Potassium: 4 mmol/L (ref 3.5–5.2)
Sodium: 141 mmol/L (ref 134–144)
Total Protein: 7.2 g/dL (ref 6.0–8.5)

## 2020-07-26 LAB — HCV INTERPRETATION

## 2020-08-07 ENCOUNTER — Telehealth: Payer: Self-pay | Admitting: Nurse Practitioner

## 2020-08-07 NOTE — Telephone Encounter (Signed)
error 

## 2020-08-07 NOTE — Telephone Encounter (Signed)
Patient called and was read lab note by Bertram Denver NP written 07/26/20. He verbalized understanding and has no questions.

## 2020-08-09 ENCOUNTER — Other Ambulatory Visit: Payer: Self-pay

## 2020-08-09 DIAGNOSIS — Z20822 Contact with and (suspected) exposure to covid-19: Secondary | ICD-10-CM

## 2020-08-10 LAB — SARS-COV-2, NAA 2 DAY TAT

## 2020-08-10 LAB — NOVEL CORONAVIRUS, NAA: SARS-CoV-2, NAA: NOT DETECTED

## 2020-08-16 ENCOUNTER — Other Ambulatory Visit: Payer: Self-pay | Admitting: Nurse Practitioner

## 2020-08-16 DIAGNOSIS — I10 Essential (primary) hypertension: Secondary | ICD-10-CM

## 2020-08-16 DIAGNOSIS — E785 Hyperlipidemia, unspecified: Secondary | ICD-10-CM

## 2020-08-16 MED ORDER — LOSARTAN POTASSIUM 50 MG PO TABS: 50.0000 mg | ORAL_TABLET | Freq: Every day | ORAL | 0 refills | Status: DC

## 2020-08-16 NOTE — Telephone Encounter (Signed)
Medication Refill - Medication: losartan (COZAAR) 50 MG tablet atorvastatin (LIPITOR) 20 MG tablet    Preferred Pharmacy (with phone number or street name): [facility] [place], [place] - [place] [address] Phone:  [phone]  Fax:  [phone]       Agent: Please be advised that RX refills may take up to 3 business days. We ask that you follow-up with your pharmacy.

## 2020-08-17 MED FILL — LOSARTAN POTASSIUM 50 MG TA: 50 | 30 days supply | Qty: 30 | Fill #0

## 2020-08-17 MED FILL — ?GLIPIZIDE 5MG TABLET: 5 | 30 days supply | Qty: 60 | Fill #1

## 2020-08-17 MED FILL — METFORMIN HCL 500 MG TABS: 500 | 30 days supply | Qty: 60 | Fill #0

## 2020-09-04 ENCOUNTER — Other Ambulatory Visit: Payer: Self-pay

## 2020-09-20 MED FILL — ATORVASTATIN CALCIUM 20 MG: 20 | 30 days supply | Qty: 30 | Fill #0

## 2020-09-20 MED FILL — LOSARTAN POTASSIUM 50 MG TA: 50 | 30 days supply | Qty: 30 | Fill #1

## 2020-09-20 MED FILL — glipiZIDE 5 MG TABS: 5 | 30 days supply | Qty: 60 | Fill #2

## 2020-10-05 ENCOUNTER — Other Ambulatory Visit: Payer: Self-pay | Admitting: Nurse Practitioner

## 2020-10-05 ENCOUNTER — Telehealth: Payer: Self-pay | Admitting: Nurse Practitioner

## 2020-10-05 DIAGNOSIS — I10 Essential (primary) hypertension: Secondary | ICD-10-CM

## 2020-10-05 DIAGNOSIS — E785 Hyperlipidemia, unspecified: Secondary | ICD-10-CM

## 2020-10-05 NOTE — Telephone Encounter (Signed)
Medication: losartan (COZAAR) 50 MG tablet [[id number]] , atorvastatin (LIPITOR) 20 MG tablet [[id number]]   Has the patient contacted their pharmacy? YES  (Agent: If no, request that the patient contact the pharmacy for the refill.) (Agent: If yes, when and what did the pharmacy advise?)  Preferred Pharmacy (with phone number or street name): [facility] - [place], [place] - [place] [address]  [address], [place] [address]  Phone:  [phone] Fax:  [phone]   Agent: Please be advised that RX refills may take up to 3 business days. We ask that you follow-up with your pharmacy.

## 2020-10-05 NOTE — Telephone Encounter (Signed)
[facility] called and spoke to [place], Pensions consultant about the refills requested. I asked does the patient have refills available. He says the patient picked up on 09/20/20, so it's too early to refill. Patient called, left VM advising of the above.

## 2020-10-05 NOTE — Telephone Encounter (Signed)
I return Pt call, LVM to call back to schedule a financial appt

## 2020-10-05 NOTE — Telephone Encounter (Signed)
Patient is calling to schedule an apt to renew his orange card with carlos. Please advise CB- [phone]

## 2020-10-15 MED FILL — ATORVASTATIN CALCIUM 20 MG: 20 | 30 days supply | Qty: 30 | Fill #1

## 2020-10-15 MED FILL — LOSARTAN POTASSIUM 50 MG TA: 50 | 30 days supply | Qty: 30 | Fill #2

## 2020-10-22 ENCOUNTER — Ambulatory Visit: Payer: Self-pay | Attending: Nurse Practitioner

## 2020-10-22 ENCOUNTER — Other Ambulatory Visit: Payer: Self-pay

## 2020-10-26 ENCOUNTER — Ambulatory Visit: Payer: Self-pay | Attending: Nurse Practitioner | Admitting: Nurse Practitioner

## 2020-10-26 ENCOUNTER — Other Ambulatory Visit: Payer: Self-pay

## 2020-10-26 ENCOUNTER — Encounter: Payer: Self-pay | Admitting: Nurse Practitioner

## 2020-10-26 DIAGNOSIS — E1165 Type 2 diabetes mellitus with hyperglycemia: Secondary | ICD-10-CM

## 2020-10-26 DIAGNOSIS — E785 Hyperlipidemia, unspecified: Secondary | ICD-10-CM

## 2020-10-26 DIAGNOSIS — I10 Essential (primary) hypertension: Secondary | ICD-10-CM

## 2020-10-26 NOTE — Progress Notes (Signed)
Virtual Visit via Telephone Note Due to national recommendations of social distancing due to [place] 19, telehealth visit is felt to be most appropriate for this patient at this time.  I discussed the limitations, risks, security and privacy concerns of performing an evaluation and management service by telephone and the availability of in person appointments. I also discussed with the patient that there may be a patient responsible charge related to this service. The patient expressed understanding and agreed to proceed.    I connected with Jesse Weiss on 10/26/20  at  10:30 AM EST  EDT by telephone and verified that I am speaking with the correct person using two identifiers.   Consent I discussed the limitations, risks, security and privacy concerns of performing an evaluation and management service by telephone and the availability of in person appointments. I also discussed with the patient that there may be a patient responsible charge related to this service. The patient expressed understanding and agreed to proceed.   Location of Patient: Private Residence    Location of Provider: [place] and [place] participating in Telemedicine visit: Geryl Rankins FNP-BC [place]    History of Present Illness: Telemedicine visit for: Follow up  DM2 Last reading postprandial 115 yesterday. Reading this morning was postprandial 175. He has not taken his metformin 500 mg or glipizide 5 mg  this morning yet. He normally takes after he eats. LDL is not at goal with taking atorvastatin 20 mg daily. He denies any symptoms of hypo or hyperglycemia.  Lab Results  Component Value Date   HGBA1C 7.7 (A) 07/25/2020   Lab Results  Component Value Date   [place] 11/01/2019    Essential Hypertension Blood pressure is well controlled. He is taking losartan 50 mg daily as prescribed. Denies chest pain, shortness of breath, palpitations, lightheadedness,  dizziness, headaches or BLE edema.  BP Readings from Last 3 Encounters:  07/25/20 129/79  11/15/19 131/84  11/01/19 (!) 150/87      Past Medical History:  Diagnosis Date  . Diabetes mellitus without complication ([place])   . Hypertension     Past Surgical History:  Procedure Laterality Date  . SHOULDER SURGERY      Family History  Problem Relation Age of Onset  . Hypertension Mother   . Diabetes Father     Social History   Socioeconomic History  . Marital status: Single    Spouse name: Not on file  . Number of children: Not on file  . Years of education: Not on file  . Highest education level: Not on file  Occupational History  . Not on file  Tobacco Use  . Smoking status: Never Smoker  . Smokeless tobacco: Never Used  Vaping Use  . Vaping Use: Never used  Substance and Sexual Activity  . Alcohol use: No  . Drug use: No  . Sexual activity: Not on file  Other Topics Concern  . Not on file  Social History Narrative  . Not on file   Social Determinants of Health   Financial Resource Strain: Not on file  Food Insecurity: Not on file  Transportation Needs: Not on file  Physical Activity: Not on file  Stress: Not on file  Social Connections: Not on file     Observations/Objective: Awake, alert and oriented x 3   Review of Systems  Constitutional: Negative for fever, malaise/fatigue and weight loss.  HENT: Negative.  Negative for nosebleeds.  Eyes: Negative.  Negative for blurred vision, double vision and photophobia.  Respiratory: Negative.  Negative for cough and shortness of breath.   Cardiovascular: Negative.  Negative for chest pain, palpitations and leg swelling.  Gastrointestinal: Negative.  Negative for heartburn, nausea and vomiting.  Musculoskeletal: Negative.  Negative for myalgias.  Neurological: Negative.  Negative for dizziness, focal weakness, seizures and headaches.  Psychiatric/Behavioral: Negative.  Negative for suicidal ideas.     Assessment and Plan: Diagnoses and all orders for this visit:  Type 2 diabetes mellitus with hyperglycemia, without long-term current use of insulin ([place]) -     Hemoglobin A1c -     Ambulatory referral to Ophthalmology Continue blood sugar control as discussed in office today, low carbohydrate diet, and regular physical exercise as tolerated, 150 minutes per week (30 min each day, 5 days per week, or 50 min 3 days per week). Keep blood sugar logs with fasting goal of 90-130 mg/dl, post prandial (after you eat) less than 180.  For Hypoglycemia: BS <60 and Hyperglycemia BS >400; contact the clinic ASAP. Annual eye exams and foot exams are recommended.  Dyslipidemia, goal LDL below 70 -     Lipid panel INSTRUCTIONS: Work on a low fat, heart healthy diet and participate in regular aerobic exercise program by working out at least 150 minutes per week; 5 days a week-30 minutes per day. Avoid red meat/beef/steak,  fried foods. junk foods, sodas, sugary drinks, unhealthy snacking, alcohol and smoking.  Drink at least 80 oz of water per day and monitor your carbohydrate intake daily.    Essential hypertension -     CMP14+EGFR Continue all antihypertensives as prescribed.  Remember to bring in your blood pressure log with you for your follow up appointment.  DASH/Mediterranean Diets are healthier choices for HTN.       Follow Up Instructions Return in about 8 weeks (around 12/21/2020) for DM HTN HPL.     I discussed the assessment and treatment plan with the patient. The patient was provided an opportunity to ask questions and all were answered. The patient agreed with the plan and demonstrated an understanding of the instructions.   The patient was advised to call back or seek an in-person evaluation if the symptoms worsen or if the condition fails to improve as anticipated.  I provided 14 minutes of non-face-to-face time during this encounter including median intraservice time, reviewing  previous notes, labs, imaging, medications and explaining diagnosis and management.  [name], FNP-BC

## 2020-10-30 ENCOUNTER — Ambulatory Visit: Payer: Self-pay | Attending: Nurse Practitioner

## 2020-10-30 ENCOUNTER — Other Ambulatory Visit: Payer: Self-pay

## 2020-10-31 LAB — CMP14+EGFR
ALT: 27 IU/L (ref 0–44)
AST: 20 IU/L (ref 0–40)
Albumin/Globulin Ratio: 1.7 (ref 1.2–2.2)
Albumin: 4.3 g/dL (ref 3.8–4.9)
Alkaline Phosphatase: 60 IU/L (ref 44–121)
BUN/Creatinine Ratio: 10 (ref 9–20)
BUN: 9 mg/dL (ref 6–24)
Bilirubin Total: 0.4 mg/dL (ref 0.0–1.2)
CO2: 19 mmol/L — ABNORMAL LOW (ref 20–29)
Calcium: 9.1 mg/dL (ref 8.7–10.2)
Chloride: 108 mmol/L — ABNORMAL HIGH (ref 96–106)
Creatinine, Ser: 0.91 mg/dL (ref 0.76–1.27)
Globulin, Total: 2.5 g/dL (ref 1.5–4.5)
Glucose: 130 mg/dL — ABNORMAL HIGH (ref 65–99)
Potassium: 3.7 mmol/L (ref 3.5–5.2)
Sodium: 145 mmol/L — ABNORMAL HIGH (ref 134–144)
Total Protein: 6.8 g/dL (ref 6.0–8.5)
eGFR: 100 mL/min/{1.73_m2} (ref >59)

## 2020-10-31 LAB — HEMOGLOBIN A1C
Est. average glucose Bld gHb Est-mCnc: 157 mg/dL
Hgb A1c MFr Bld: 7.1 % — ABNORMAL HIGH (ref 4.8–5.6)

## 2020-10-31 LAB — LIPID PANEL
Chol/HDL Ratio: 3.6 ratio (ref 0.0–5.0)
Cholesterol, Total: 129 mg/dL (ref 100–199)
HDL: 36 mg/dL — ABNORMAL LOW (ref >39)
LDL Chol Calc (NIH): 59 mg/dL (ref 0–99)
Triglycerides: 209 mg/dL — ABNORMAL HIGH (ref 0–149)
VLDL Cholesterol Cal: 34 mg/dL (ref 5–40)

## 2020-11-06 ENCOUNTER — Telehealth: Payer: Self-pay | Admitting: Nurse Practitioner

## 2020-11-06 NOTE — Telephone Encounter (Signed)
Pt was sent a letter from financial dept. Inform them, that the application they submitted was incomplete, since they were missing some documentation at the time of the appointment, Pt need to reschedule and resubmit all new papers and application for CAFA and [facility], P.S. old documents has been sent back by mail to the Pt and Pt. need to make a new appt. 

## 2020-11-16 ENCOUNTER — Other Ambulatory Visit: Payer: Self-pay | Admitting: Nurse Practitioner

## 2020-11-16 DIAGNOSIS — I10 Essential (primary) hypertension: Secondary | ICD-10-CM

## 2020-11-16 DIAGNOSIS — E785 Hyperlipidemia, unspecified: Secondary | ICD-10-CM

## 2020-11-16 DIAGNOSIS — E1165 Type 2 diabetes mellitus with hyperglycemia: Secondary | ICD-10-CM

## 2020-11-16 MED ORDER — ATORVASTATIN CALCIUM 20 MG PO TABS: 20.0000 mg | ORAL_TABLET | Freq: Every day | ORAL | 0 refills | Status: DC

## 2020-11-16 MED ORDER — METFORMIN HCL 500 MG PO TABS: 500.0000 mg | ORAL_TABLET | Freq: Two times a day (BID) | ORAL | 1 refills | Status: DC

## 2020-11-16 MED ORDER — LOSARTAN POTASSIUM 50 MG PO TABS
50.0000 mg | ORAL_TABLET | Freq: Every day | ORAL | 0 refills | Status: DC
Start: 2020-11-16 — End: 2020-11-16

## 2020-11-16 MED FILL — ?ATORVASTATIN 20 MG TABLET: 20 | 30 days supply | Qty: 30 | Fill #0

## 2020-11-16 NOTE — Telephone Encounter (Signed)
Medication Refill - Medication:   losartan (COZAAR) 50 MG tablet  metFORMIN (GLUCOPHAGE) 500 MG tablet(Expired) atorvastatin (LIPITOR) 20 MG tablet     Preferred Pharmacy (with phone number or street name):  [facility] - [place], [place] - [place] [address] Phone:  [phone]  Fax:  [phone]       Agent: Please be advised that RX refills may take up to 3 business days. We ask that you follow-up with your pharmacy.

## 2020-12-11 ENCOUNTER — Other Ambulatory Visit: Payer: Self-pay | Admitting: Nurse Practitioner

## 2020-12-11 ENCOUNTER — Other Ambulatory Visit: Payer: Self-pay

## 2020-12-11 DIAGNOSIS — E1165 Type 2 diabetes mellitus with hyperglycemia: Secondary | ICD-10-CM

## 2020-12-11 MED ORDER — GLIPIZIDE 5 MG PO TABS
ORAL_TABLET | Freq: Two times a day (BID) | ORAL | 0 refills | Status: DC
  Filled 2020-12-11: qty 60, 30d supply, fill #0
  Filled 2021-01-14: qty 60, 30d supply, fill #1
  Filled 2021-08-07: qty 60, 30d supply, fill #2

## 2020-12-11 MED FILL — Losartan Potassium Tab 50 MG: ORAL | 30 days supply | Qty: 30 | Fill #0 | Status: AC

## 2020-12-11 MED FILL — Metformin HCl Tab 500 MG: ORAL | 30 days supply | Qty: 60 | Fill #0 | Status: AC

## 2020-12-19 ENCOUNTER — Ambulatory Visit: Payer: Self-pay | Admitting: Nurse Practitioner

## 2021-01-14 ENCOUNTER — Other Ambulatory Visit: Payer: Self-pay | Admitting: Nurse Practitioner

## 2021-01-14 ENCOUNTER — Other Ambulatory Visit: Payer: Self-pay

## 2021-01-14 DIAGNOSIS — E1165 Type 2 diabetes mellitus with hyperglycemia: Secondary | ICD-10-CM

## 2021-01-14 MED ORDER — METFORMIN HCL 500 MG PO TABS
ORAL_TABLET | Freq: Two times a day (BID) | ORAL | 0 refills | Status: DC
  Filled 2021-01-14: qty 60, 30d supply, fill #0
  Filled 2021-04-29 – 2021-05-07 (×2): qty 60, 30d supply, fill #1

## 2021-01-14 MED FILL — Losartan Potassium Tab 50 MG: ORAL | 30 days supply | Qty: 30 | Fill #1 | Status: AC

## 2021-01-14 MED FILL — Atorvastatin Calcium Tab 20 MG (Base Equivalent): ORAL | 30 days supply | Qty: 30 | Fill #0 | Status: AC

## 2021-01-14 NOTE — Telephone Encounter (Signed)
Requested Prescriptions  Pending Prescriptions Disp Refills  . metFORMIN (GLUCOPHAGE) 500 MG tablet 120 tablet 0    Sig: TAKE 1 TABLET (500 MG TOTAL) BY MOUTH 2 (TWO) TIMES DAILY WITH A MEAL.     Endocrinology:  Diabetes - Biguanides Passed - 01/14/2021 10:58 AM      Passed - Cr in normal range and within 360 days    Creatinine, Ser  Date Value Ref Range Status  10/30/2020 0.91 0.76 - 1.27 mg/dL Final         Passed - HBA1C is between 0 and 7.9 and within 180 days    Hgb A1c MFr Bld  Date Value Ref Range Status  10/30/2020 7.1 (H) 4.8 - 5.6 % Final    Comment:             Prediabetes: 5.7 - 6.4          Diabetes: >6.4          Glycemic control for adults with diabetes: <7.0          Passed - AA eGFR in normal range and within 360 days    GFR calc Af Amer  Date Value Ref Range Status  07/25/2020 103 >59 mL/min/1.73 Final    Comment:    **In accordance with recommendations from the NKF-ASN Task force,**   [facility] is in the process of updating its eGFR calculation to the   2021 CKD-EPI creatinine equation that estimates kidney function   without a race variable.    GFR calc non Af Amer  Date Value Ref Range Status  07/25/2020 89 >59 mL/min/1.73 Final   eGFR  Date Value Ref Range Status  10/30/2020 100 >59 mL/min/1.73 Final    Comment:    **In accordance with recommendations from the NKF-ASN Task force,**   [facility] has updated its eGFR calculation to the 2021 CKD-EPI   creatinine equation that estimates kidney function without a race   variable.          Passed - Valid encounter within last 6 months    Recent Outpatient Visits          2 months ago Type 2 diabetes mellitus with hyperglycemia, without long-term current use of insulin [facility])   [place], [place] W, NP   5 months ago Type 2 diabetes mellitus with hyperglycemia, without long-term current use of insulin [facility])   [place], Vernia Buff, NP   1 year ago Essential hypertension   [place], Jarome Matin, RPH-CPP   1 year ago Type 2 diabetes mellitus with hyperglycemia, unspecified whether long term insulin use [facility])   [place], [place] W, NP   2 years ago Controlled type 2 diabetes mellitus with complication, without long-term current use of insulin [facility])   [place], [place] W, NP      Future Appointments            In 1 month [name], NP [place]

## 2021-01-16 ENCOUNTER — Other Ambulatory Visit: Payer: Self-pay

## 2021-01-17 ENCOUNTER — Other Ambulatory Visit: Payer: Self-pay

## 2021-02-20 ENCOUNTER — Other Ambulatory Visit: Payer: Self-pay | Admitting: Nurse Practitioner

## 2021-02-20 ENCOUNTER — Other Ambulatory Visit: Payer: Self-pay

## 2021-02-20 DIAGNOSIS — I10 Essential (primary) hypertension: Secondary | ICD-10-CM

## 2021-02-20 MED ORDER — LOSARTAN POTASSIUM 50 MG PO TABS
ORAL_TABLET | Freq: Every day | ORAL | 0 refills | Status: DC
  Filled 2021-02-20: qty 30, 30d supply, fill #0
  Filled 2021-03-18: qty 30, 30d supply, fill #1
  Filled 2021-04-29 – 2021-05-07 (×2): qty 30, 30d supply, fill #2

## 2021-02-20 MED FILL — Atorvastatin Calcium Tab 20 MG (Base Equivalent): ORAL | 30 days supply | Qty: 30 | Fill #1 | Status: AC

## 2021-02-22 ENCOUNTER — Other Ambulatory Visit: Payer: Self-pay

## 2021-03-01 ENCOUNTER — Other Ambulatory Visit: Payer: Self-pay

## 2021-03-01 ENCOUNTER — Ambulatory Visit: Payer: Self-pay | Attending: Nurse Practitioner | Admitting: Nurse Practitioner

## 2021-03-01 ENCOUNTER — Encounter: Payer: Self-pay | Admitting: Nurse Practitioner

## 2021-03-01 VITALS — BP 148/88 | HR 69 | Ht 71.0 in | Wt 202.6 lb

## 2021-03-01 DIAGNOSIS — Z1211 Encounter for screening for malignant neoplasm of colon: Secondary | ICD-10-CM

## 2021-03-01 DIAGNOSIS — I10 Essential (primary) hypertension: Secondary | ICD-10-CM

## 2021-03-01 DIAGNOSIS — Z125 Encounter for screening for malignant neoplasm of prostate: Secondary | ICD-10-CM

## 2021-03-01 DIAGNOSIS — E1165 Type 2 diabetes mellitus with hyperglycemia: Secondary | ICD-10-CM

## 2021-03-01 DIAGNOSIS — E785 Hyperlipidemia, unspecified: Secondary | ICD-10-CM

## 2021-03-01 LAB — GLUCOSE, POCT (MANUAL RESULT ENTRY): POC Glucose: 124 mg/dL — AB (ref 70–99)

## 2021-03-01 NOTE — Progress Notes (Signed)
Assessment & Plan:  Jesse Weiss was seen today for diabetes and hypertension.  Diagnoses and all orders for this visit:  Type 2 diabetes mellitus with hyperglycemia, without long-term current use of insulin (HCC) -     POCT glucose (manual entry) -     Hemoglobin A1c Continue blood sugar control as discussed in office today, low carbohydrate diet, and regular physical exercise as tolerated, 150 minutes per week (30 min each day, 5 days per week, or 50 min 3 days per week). Keep blood sugar logs with fasting goal of 90-130 mg/dl, post prandial (after you eat) less than 180.  For Hypoglycemia: BS <60 and Hyperglycemia BS >400; contact the clinic ASAP. Annual eye exams and foot exams are recommended.   Primary hypertension -     CMP14+EGFR Continue all antihypertensives as prescribed.  Remember to bring in your blood pressure log with you for your follow up appointment.  DASH/Mediterranean Diets are healthier choices for HTN.    Colon cancer screening -     Fecal occult blood, imunochemical([facility])  Prostate cancer screening -     PSA  Dyslipidemia INSTRUCTIONS: Work on a low fat, heart healthy diet and participate in regular aerobic exercise program by working out at least 150 minutes per week; 5 days a week-30 minutes per day. Avoid red meat/beef/steak,  fried foods. junk foods, sodas, sugary drinks, unhealthy snacking, alcohol and smoking.  Drink at least 80 oz of water per day and monitor your carbohydrate intake daily.     Patient has been counseled on age-appropriate routine health concerns for screening and prevention. These are reviewed and up-to-date. Referrals have been placed accordingly. Immunizations are up-to-date or declined.    Subjective:   Chief Complaint  Patient presents with   Diabetes   Hypertension   HPI Jesse Weiss 55 y.o. male presents to office today for HTN.  He has a past medical history of DM2 and Hypertension.   Essential Hypertension Blood  pressure is elevated today. He has been prescribed losartan 50 mg daily. Denies chest pain, shortness of breath, palpitations, lightheadedness, dizziness, headaches or BLE edema.   BP Readings from Last 3 Encounters:  03/01/21 (!) 148/88  07/25/20 129/79  11/15/19 131/84    DM2 Not at goal. He is currently taking glipzide 5 mg BID and metformin 500 mg BID. Will LDL at goal with atorvastatin 20 mg daily. Will increase metformin to 1000 mg BID.  Lab Results  Component Value Date   HGBA1C 7.3 (H) 03/01/2021    Lab Results  Component Value Date   LDLCALC 59 10/30/2020    Review of Systems  Constitutional:  Negative for fever, malaise/fatigue and weight loss.  HENT: Negative.  Negative for nosebleeds.   Eyes: Negative.  Negative for blurred vision, double vision and photophobia.  Respiratory: Negative.  Negative for cough and shortness of breath.   Cardiovascular: Negative.  Negative for chest pain, palpitations and leg swelling.  Gastrointestinal: Negative.  Negative for heartburn, nausea and vomiting.  Musculoskeletal: Negative.  Negative for myalgias.  Neurological: Negative.  Negative for dizziness, focal weakness, seizures and headaches.  Psychiatric/Behavioral: Negative.  Negative for suicidal ideas.    Past Medical History:  Diagnosis Date   Diabetes mellitus without complication ([place])    Hypertension     Past Surgical History:  Procedure Laterality Date   SHOULDER SURGERY      Family History  Problem Relation Age of Onset   Hypertension Mother    Diabetes Father  Social History Reviewed with no changes to be made today.   Outpatient Medications Prior to Visit  Medication Sig Dispense Refill   atorvastatin (LIPITOR) 20 MG tablet TAKE 1 TABLET (20 MG TOTAL) BY MOUTH DAILY. 90 tablet 0   blood glucose meter kit and supplies Dispense based on patient and insurance preference. Use up to four times daily as directed. (FOR ICD-9 250.00, 250.01). 1 each 0   Blood  Glucose Monitoring Suppl (TRUE METRIX METER) w/Device KIT Use as instructed. Check blood glucose level by fingerstick twice per day. 1 kit 0   glipiZIDE (GLUCOTROL) 5 MG tablet TAKE 1 TABLET (5 MG TOTAL) BY MOUTH 2 (TWO) TIMES DAILY BEFORE A MEAL. 180 tablet 0   glucose blood (TRUE METRIX BLOOD GLUCOSE TEST) test strip Use as instructed. Check blood glucose level by fingerstick twice per day. 100 each 12   losartan (COZAAR) 50 MG tablet TAKE 1 TABLET (50 MG TOTAL) BY MOUTH DAILY. 90 tablet 0   metFORMIN (GLUCOPHAGE) 500 MG tablet TAKE 1 TABLET (500 MG TOTAL) BY MOUTH 2 (TWO) TIMES DAILY WITH A MEAL. 120 tablet 0   TRUEplus Lancets 28G MISC USE AS INSTRUCTED. CHECK BLOOD GLUCOSE LEVEL BY FINGERSTICK TWICE PER DAY. 100 each 3   No facility-administered medications prior to visit.    Not on File     Objective:    BP (!) 148/88   Pulse 69   Ht _0  (1.803 m)   Wt 202 lb 9.6 oz (91.9 kg)   SpO2 98%   BMI 28.26 kg/m  Wt Readings from Last 3 Encounters:  03/01/21 202 lb 9.6 oz (91.9 kg)  07/25/20 207 lb (93.9 kg)  11/01/19 203 lb (92.1 kg)    Physical Exam Vitals and nursing note reviewed.  Constitutional:      Appearance: He is well-developed.  HENT:     Head: Normocephalic and atraumatic.  Cardiovascular:     Rate and Rhythm: Normal rate and regular rhythm.     Heart sounds: Normal heart sounds. No murmur heard.   No friction rub. No gallop.  Pulmonary:     Effort: Pulmonary effort is normal. No tachypnea or respiratory distress.     Breath sounds: Normal breath sounds. No decreased breath sounds, wheezing, rhonchi or rales.  Chest:     Chest wall: No tenderness.  Abdominal:     General: Bowel sounds are normal.     Palpations: Abdomen is soft.  Musculoskeletal:        General: Normal range of motion.     Cervical back: Normal range of motion.  Skin:    General: Skin is warm and dry.  Neurological:     Mental Status: He is alert and oriented to person, place, and  time.     Coordination: Coordination normal.  Psychiatric:        Behavior: Behavior normal. Behavior is cooperative.        Thought Content: Thought content normal.        Judgment: Judgment normal.         Patient has been counseled extensively about nutrition and exercise as well as the importance of adherence with medications and regular follow-up. The patient was given clear instructions to go to ER or return to medical center if symptoms don't improve, worsen or new problems develop. The patient verbalized understanding.   Follow-up: Return in about 3 months (around 06/01/2021).   [name], FNP-BC [facility],  [place] [phone]   03/03/2021, 5:52 PM

## 2021-03-02 LAB — CMP14+EGFR
ALT: 23 IU/L (ref 0–44)
AST: 19 IU/L (ref 0–40)
Albumin/Globulin Ratio: 1.8 (ref 1.2–2.2)
Albumin: 4.4 g/dL (ref 3.8–4.9)
Alkaline Phosphatase: 61 IU/L (ref 44–121)
BUN/Creatinine Ratio: 11 (ref 9–20)
BUN: 10 mg/dL (ref 6–24)
Bilirubin Total: 0.5 mg/dL (ref 0.0–1.2)
CO2: 22 mmol/L (ref 20–29)
Calcium: 9.4 mg/dL (ref 8.7–10.2)
Chloride: 107 mmol/L — ABNORMAL HIGH (ref 96–106)
Creatinine, Ser: 0.87 mg/dL (ref 0.76–1.27)
Globulin, Total: 2.5 g/dL (ref 1.5–4.5)
Glucose: 109 mg/dL — ABNORMAL HIGH (ref 65–99)
Potassium: 4 mmol/L (ref 3.5–5.2)
Sodium: 142 mmol/L (ref 134–144)
Total Protein: 6.9 g/dL (ref 6.0–8.5)
eGFR: 103 mL/min/{1.73_m2} (ref >59)

## 2021-03-02 LAB — HEMOGLOBIN A1C
Est. average glucose Bld gHb Est-mCnc: 163 mg/dL
Hgb A1c MFr Bld: 7.3 % — ABNORMAL HIGH (ref 4.8–5.6)

## 2021-03-02 LAB — PSA: Prostate Specific Ag, Serum: 0.8 ng/mL (ref 0.0–4.0)

## 2021-03-03 ENCOUNTER — Encounter: Payer: Self-pay | Admitting: Nurse Practitioner

## 2021-03-08 LAB — FECAL OCCULT BLOOD, IMMUNOCHEMICAL: Fecal Occult Bld: NEGATIVE

## 2021-03-15 ENCOUNTER — Other Ambulatory Visit: Payer: Self-pay

## 2021-03-18 ENCOUNTER — Other Ambulatory Visit: Payer: Self-pay

## 2021-03-18 ENCOUNTER — Other Ambulatory Visit: Payer: Self-pay | Admitting: Nurse Practitioner

## 2021-03-18 DIAGNOSIS — E785 Hyperlipidemia, unspecified: Secondary | ICD-10-CM

## 2021-03-18 MED ORDER — ATORVASTATIN CALCIUM 20 MG PO TABS
ORAL_TABLET | Freq: Every day | ORAL | 0 refills | Status: DC
  Filled 2021-03-18: qty 30, 30d supply, fill #0
  Filled 2021-04-29 – 2021-05-07 (×2): qty 30, 30d supply, fill #1
  Filled 2021-06-21: qty 30, 30d supply, fill #2

## 2021-03-19 ENCOUNTER — Other Ambulatory Visit: Payer: Self-pay

## 2021-04-29 ENCOUNTER — Other Ambulatory Visit: Payer: Self-pay

## 2021-05-07 ENCOUNTER — Other Ambulatory Visit: Payer: Self-pay

## 2021-06-21 ENCOUNTER — Other Ambulatory Visit: Payer: Self-pay | Admitting: Nurse Practitioner

## 2021-06-21 ENCOUNTER — Other Ambulatory Visit: Payer: Self-pay

## 2021-06-21 DIAGNOSIS — I10 Essential (primary) hypertension: Secondary | ICD-10-CM

## 2021-06-21 DIAGNOSIS — E1165 Type 2 diabetes mellitus with hyperglycemia: Secondary | ICD-10-CM

## 2021-06-21 MED ORDER — METFORMIN HCL 500 MG PO TABS
ORAL_TABLET | Freq: Two times a day (BID) | ORAL | 0 refills | Status: DC
  Filled 2021-06-21: qty 60, 30d supply, fill #0

## 2021-06-21 MED ORDER — LOSARTAN POTASSIUM 50 MG PO TABS
ORAL_TABLET | Freq: Every day | ORAL | 0 refills | Status: DC
Start: 2021-06-21 — End: 2021-08-07
  Filled 2021-06-21: qty 30, 30d supply, fill #0

## 2021-06-21 NOTE — Telephone Encounter (Signed)
Requested Prescriptions  Pending Prescriptions Disp Refills  . metFORMIN (GLUCOPHAGE) 500 MG tablet 60 tablet 0    Sig: TAKE 1 TABLET (500 MG TOTAL) BY MOUTH 2 (TWO) TIMES DAILY WITH A MEAL.     Endocrinology:  Diabetes - Biguanides Passed - 06/21/2021  9:13 AM      Passed - Cr in normal range and within 360 days    Creatinine, Ser  Date Value Ref Range Status  03/01/2021 0.87 0.76 - 1.27 mg/dL Final         Passed - HBA1C is between 0 and 7.9 and within 180 days    Hgb A1c MFr Bld  Date Value Ref Range Status  03/01/2021 7.3 (H) 4.8 - 5.6 % Final    Comment:             Prediabetes: 5.7 - 6.4          Diabetes: >6.4          Glycemic control for adults with diabetes: <7.0          Passed - AA eGFR in normal range and within 360 days    GFR calc Af Amer  Date Value Ref Range Status  07/25/2020 103 >59 mL/min/1.73 Final    Comment:    **In accordance with recommendations from the NKF-ASN Task force,**   [facility] is in the process of updating its eGFR calculation to the   2021 CKD-EPI creatinine equation that estimates kidney function   without a race variable.    GFR calc non Af Amer  Date Value Ref Range Status  07/25/2020 89 >59 mL/min/1.73 Final   eGFR  Date Value Ref Range Status  03/01/2021 103 >59 mL/min/1.73 Final         Passed - Valid encounter within last 6 months    Recent Outpatient Visits          3 months ago Type 2 diabetes mellitus with hyperglycemia, without long-term current use of insulin [facility])   [place], [place] W, NP   7 months ago Type 2 diabetes mellitus with hyperglycemia, without long-term current use of insulin [facility])   [place], [place] W, NP   11 months ago Type 2 diabetes mellitus with hyperglycemia, without long-term current use of insulin [facility])   [place], Vernia Buff, NP   1 year ago Essential hypertension   [place], Jarome Matin, RPH-CPP   1 year ago Type 2 diabetes mellitus with hyperglycemia, unspecified whether long term insulin use [facility])   [place], [place] W, NP             . losartan (COZAAR) 50 MG tablet 30 tablet 0    Sig: TAKE 1 TABLET (50 MG TOTAL) BY MOUTH DAILY.     Cardiovascular:  Angiotensin Receptor Blockers Failed - 06/21/2021  9:13 AM      Failed - Last BP in normal range    BP Readings from Last 1 Encounters:  03/01/21 (!) 148/88         Passed - Cr in normal range and within 180 days    Creatinine, Ser  Date Value Ref Range Status  03/01/2021 0.87 0.76 - 1.27 mg/dL Final         Passed - K in normal range and within 180 days    Potassium  Date Value  Ref Range Status  03/01/2021 4.0 3.5 - 5.2 mmol/L Final         Passed - Patient is not pregnant      Passed - Valid encounter within last 6 months    Recent Outpatient Visits          3 months ago Type 2 diabetes mellitus with hyperglycemia, without long-term current use of insulin [facility])   [place], [place] W, NP   7 months ago Type 2 diabetes mellitus with hyperglycemia, without long-term current use of insulin [facility])   [place], [place] W, NP   11 months ago Type 2 diabetes mellitus with hyperglycemia, without long-term current use of insulin [facility])   [place], Vernia Buff, NP   1 year ago Essential hypertension   [place], RPH-CPP   1 year ago Type 2 diabetes mellitus with hyperglycemia, unspecified whether long term insulin use [facility])   [place], Vernia Buff, NP

## 2021-06-24 ENCOUNTER — Other Ambulatory Visit: Payer: Self-pay

## 2021-06-24 NOTE — Telephone Encounter (Signed)
Pt called and scheduled next available with PCP, requesting refill to last him until his scheduled time. Both Losartan and metformin. Please advise

## 2021-06-25 ENCOUNTER — Other Ambulatory Visit: Payer: Self-pay

## 2021-06-25 NOTE — Telephone Encounter (Signed)
Rxs are ready for pick-up. Patient called and informed. He will pick them up this afternoon.

## 2021-07-17 ENCOUNTER — Other Ambulatory Visit (HOSPITAL_BASED_OUTPATIENT_CLINIC_OR_DEPARTMENT_OTHER): Payer: Self-pay

## 2021-08-07 ENCOUNTER — Other Ambulatory Visit: Payer: Self-pay

## 2021-08-07 ENCOUNTER — Other Ambulatory Visit: Payer: Self-pay | Admitting: Nurse Practitioner

## 2021-08-07 DIAGNOSIS — E785 Hyperlipidemia, unspecified: Secondary | ICD-10-CM

## 2021-08-07 DIAGNOSIS — I10 Essential (primary) hypertension: Secondary | ICD-10-CM

## 2021-08-07 MED ORDER — LOSARTAN POTASSIUM 50 MG PO TABS
ORAL_TABLET | Freq: Every day | ORAL | 0 refills | Status: DC
  Filled 2021-08-07: qty 30, 30d supply, fill #0

## 2021-08-07 MED ORDER — ATORVASTATIN CALCIUM 20 MG PO TABS
ORAL_TABLET | Freq: Every day | ORAL | 0 refills | Status: DC
  Filled 2021-08-07: qty 30, 30d supply, fill #0
  Filled 2021-08-30: qty 30, 30d supply, fill #1
  Filled 2021-10-21: qty 30, 30d supply, fill #0

## 2021-08-07 NOTE — Telephone Encounter (Signed)
Requested Prescriptions  Pending Prescriptions Disp Refills  . atorvastatin (LIPITOR) 20 MG tablet 90 tablet 0    Sig: TAKE 1 TABLET (20 MG TOTAL) BY MOUTH DAILY.     Cardiovascular:  Antilipid - Statins Failed - 08/07/2021  2:56 PM      Failed - HDL in normal range and within 360 days    HDL  Date Value Ref Range Status  10/30/2020 36 (L) >39 mg/dL Final         Failed - Triglycerides in normal range and within 360 days    Triglycerides  Date Value Ref Range Status  10/30/2020 209 (H) 0 - 149 mg/dL Final         Passed - Total Cholesterol in normal range and within 360 days    Cholesterol, Total  Date Value Ref Range Status  10/30/2020 129 100 - 199 mg/dL Final         Passed - LDL in normal range and within 360 days    LDL Chol Calc (NIH)  Date Value Ref Range Status  10/30/2020 59 0 - 99 mg/dL Final         Passed - Patient is not pregnant      Passed - Valid encounter within last 12 months    Recent Outpatient Visits          5 months ago Type 2 diabetes mellitus with hyperglycemia, without long-term current use of insulin (HCC)   [place] [facility] [place], [place] W, NP   9 months ago Type 2 diabetes mellitus with hyperglycemia, without long-term current use of insulin [facility])   [place] [facility] [place], [place] W, NP   1 year ago Type 2 diabetes mellitus with hyperglycemia, without long-term current use of insulin [facility])   [place] [facility] [place], Shea Stakes, NP   1 year ago Essential hypertension   [place] [facility] [place], Cornelius Moras, RPH-CPP   1 year ago Type 2 diabetes mellitus with hyperglycemia, unspecified whether long term insulin use [facility])   [place] [facility] [place], Shea Stakes, NP      Future Appointments            In 3 weeks Claiborne Rigg, NP [facility]           . losartan (COZAAR) 50 MG tablet 30  tablet 0    Sig: TAKE 1 TABLET (50 MG TOTAL) BY MOUTH DAILY.     Cardiovascular:  Angiotensin Receptor Blockers Failed - 08/07/2021  2:56 PM      Failed - Last BP in normal range    BP Readings from Last 1 Encounters:  03/01/21 (!) 148/88         Passed - Cr in normal range and within 180 days    Creatinine, Ser  Date Value Ref Range Status  03/01/2021 0.87 0.76 - 1.27 mg/dL Final         Passed - K in normal range and within 180 days    Potassium  Date Value Ref Range Status  03/01/2021 4.0 3.5 - 5.2 mmol/L Final         Passed - Patient is not pregnant      Passed - Valid encounter within last 6 months    Recent Outpatient Visits          5 months ago Type 2 diabetes mellitus with hyperglycemia, without  long-term current use of insulin [facility])   [place] [facility] [place], [place] W, NP   9 months ago Type 2 diabetes mellitus with hyperglycemia, without long-term current use of insulin [facility])   [place] [facility] [place], [place] W, NP   1 year ago Type 2 diabetes mellitus with hyperglycemia, without long-term current use of insulin [facility])   [place] [facility] Claiborne Rigg, NP   1 year ago Essential hypertension   [place] [place], Cornelius Moras, RPH-CPP   1 year ago Type 2 diabetes mellitus with hyperglycemia, unspecified whether long term insulin use [facility])   [place] [facility] Claiborne Rigg, NP      Future Appointments            In 3 weeks Claiborne Rigg, NP [employer] [facility]

## 2021-08-08 ENCOUNTER — Other Ambulatory Visit: Payer: Self-pay

## 2021-08-09 ENCOUNTER — Other Ambulatory Visit: Payer: Self-pay

## 2021-08-30 ENCOUNTER — Other Ambulatory Visit: Payer: Self-pay | Admitting: Nurse Practitioner

## 2021-08-30 ENCOUNTER — Ambulatory Visit: Payer: Self-pay | Admitting: Nurse Practitioner

## 2021-08-30 ENCOUNTER — Other Ambulatory Visit: Payer: Self-pay

## 2021-08-30 DIAGNOSIS — I10 Essential (primary) hypertension: Secondary | ICD-10-CM

## 2021-08-30 DIAGNOSIS — E1165 Type 2 diabetes mellitus with hyperglycemia: Secondary | ICD-10-CM

## 2021-08-30 MED ORDER — METFORMIN HCL 500 MG PO TABS
ORAL_TABLET | Freq: Two times a day (BID) | ORAL | 0 refills | Status: DC
Start: 2021-08-30 — End: 2021-11-04
  Filled 2021-08-30 – 2021-10-21 (×2): qty 60, 30d supply, fill #0

## 2021-08-30 MED ORDER — LOSARTAN POTASSIUM 50 MG PO TABS
ORAL_TABLET | Freq: Every day | ORAL | 0 refills | Status: DC
Start: 2021-08-30 — End: 2021-11-04
  Filled 2021-08-30 – 2021-10-21 (×2): qty 30, 30d supply, fill #0

## 2021-08-30 NOTE — Telephone Encounter (Signed)
Requested Prescriptions  Pending Prescriptions Disp Refills   metFORMIN (GLUCOPHAGE) 500 MG tablet 60 tablet 0    Sig: TAKE 1 TABLET (500 MG TOTAL) BY MOUTH 2 (TWO) TIMES DAILY WITH A MEAL.     Endocrinology:  Diabetes - Biguanides Failed - 08/30/2021 11:06 AM      Failed - HBA1C is between 0 and 7.9 and within 180 days    Hgb A1c MFr Bld  Date Value Ref Range Status  03/01/2021 7.3 (H) 4.8 - 5.6 % Final    Comment:             Prediabetes: 5.7 - 6.4          Diabetes: >6.4          Glycemic control for adults with diabetes: <7.0          Failed - AA eGFR in normal range and within 360 days    GFR calc Af Amer  Date Value Ref Range Status  07/25/2020 103 >59 mL/min/1.73 Final    Comment:    **In accordance with recommendations from the NKF-ASN Task force,**   [facility] is in the process of updating its eGFR calculation to the   2021 CKD-EPI creatinine equation that estimates kidney function   without a race variable.    GFR calc non Af Amer  Date Value Ref Range Status  07/25/2020 89 >59 mL/min/1.73 Final   eGFR  Date Value Ref Range Status  03/01/2021 103 >59 mL/min/1.73 Final         Failed - Valid encounter within last 6 months    Recent Outpatient Visits          6 months ago Type 2 diabetes mellitus with hyperglycemia, without long-term current use of insulin [facility])   [place], [place] W, NP   10 months ago Type 2 diabetes mellitus with hyperglycemia, without long-term current use of insulin [facility])   [place], [place] W, NP   1 year ago Type 2 diabetes mellitus with hyperglycemia, without long-term current use of insulin [facility])   [place], Vernia Buff, NP   1 year ago Essential hypertension   [place], Jarome Matin, RPH-CPP   1 year ago Type 2 diabetes mellitus with hyperglycemia, unspecified whether long term  insulin use [facility])   [place], Vernia Buff, NP      Future Appointments            In 2 months [name], NP [place] - Cr in normal range and within 360 days    Creatinine, Ser  Date Value Ref Range Status  03/01/2021 0.87 0.76 - 1.27 mg/dL Final          losartan (COZAAR) 50 MG tablet 30 tablet 0    Sig: TAKE 1 TABLET (50 MG TOTAL) BY MOUTH DAILY.     Cardiovascular:  Angiotensin Receptor Blockers Failed - 08/30/2021 11:06 AM      Failed - Cr in normal range and within 180 days    Creatinine, Ser  Date Value Ref Range Status  03/01/2021 0.87 0.76 - 1.27 mg/dL Final         Failed - K in normal range and within 180 days    Potassium  Date Value Ref  Range Status  °03/01/2021 4.0 3.5 - 5.2 mmol/L Final  °   °  °  Failed - Last BP in normal range  °  BP Readings from Last 1 Encounters:  °03/01/21 (!) 148/88  °   °  °  Failed - Valid encounter within last 6 months  °  Recent Outpatient Visits   °      ° 6 months ago Type 2 diabetes mellitus with hyperglycemia, without long-term current use of insulin (HCC)  ° [place] [place], Zelda W, NP  ° 10 months ago Type 2 diabetes mellitus with hyperglycemia, without long-term current use of insulin (HCC)  ° [place] [facility] [place], Zelda W, NP  ° 1 year ago Type 2 diabetes mellitus with hyperglycemia, without long-term current use of insulin (HCC)  ° [place] [facility] [place], Zelda W, NP  ° 1 year ago Essential hypertension  ° [place] [facility] [place], Stephen L, RPH-CPP  ° 1 year ago Type 2 diabetes mellitus with hyperglycemia, unspecified whether long term insulin use (HCC)  ° [place] [facility] [place], Zelda W, NP  °  °  °Future Appointments   °        ° In 2 months [place], Zelda W, NP [facility]  °  ° °  °  °  Passed - Patient is not pregnant  °  °  ° ° °

## 2021-09-02 ENCOUNTER — Other Ambulatory Visit: Payer: Self-pay

## 2021-09-03 ENCOUNTER — Other Ambulatory Visit: Payer: Self-pay

## 2021-10-21 ENCOUNTER — Other Ambulatory Visit: Payer: Self-pay

## 2021-10-25 ENCOUNTER — Other Ambulatory Visit: Payer: Self-pay | Admitting: Nurse Practitioner

## 2021-10-25 ENCOUNTER — Other Ambulatory Visit: Payer: Self-pay

## 2021-10-25 DIAGNOSIS — E1165 Type 2 diabetes mellitus with hyperglycemia: Secondary | ICD-10-CM

## 2021-10-25 MED ORDER — GLIPIZIDE 5 MG PO TABS
ORAL_TABLET | Freq: Two times a day (BID) | ORAL | 0 refills | Status: DC
  Filled 2021-10-25: qty 60, 30d supply, fill #0

## 2021-11-01 ENCOUNTER — Other Ambulatory Visit: Payer: Self-pay

## 2021-11-04 ENCOUNTER — Encounter: Payer: Self-pay | Admitting: Nurse Practitioner

## 2021-11-04 ENCOUNTER — Ambulatory Visit: Payer: Self-pay | Attending: Nurse Practitioner | Admitting: Nurse Practitioner

## 2021-11-04 ENCOUNTER — Other Ambulatory Visit: Payer: Self-pay

## 2021-11-04 VITALS — BP 143/93 | HR 79 | Resp 16 | Ht 71.0 in | Wt 202.4 lb

## 2021-11-04 DIAGNOSIS — E785 Hyperlipidemia, unspecified: Secondary | ICD-10-CM

## 2021-11-04 DIAGNOSIS — E1165 Type 2 diabetes mellitus with hyperglycemia: Secondary | ICD-10-CM

## 2021-11-04 DIAGNOSIS — I10 Essential (primary) hypertension: Secondary | ICD-10-CM

## 2021-11-04 LAB — POCT GLYCOSYLATED HEMOGLOBIN (HGB A1C): Hemoglobin A1C: 7.9 % — AB (ref 4.0–5.6)

## 2021-11-04 LAB — GLUCOSE, POCT (MANUAL RESULT ENTRY): POC Glucose: 153 mg/dl — AB (ref 70–99)

## 2021-11-04 MED ORDER — ATORVASTATIN CALCIUM 20 MG PO TABS
ORAL_TABLET | Freq: Every day | ORAL | 0 refills | Status: DC
  Filled 2021-11-04: qty 90, fill #0
  Filled 2021-12-10: qty 30, 30d supply, fill #0
  Filled 2022-01-16: qty 30, 30d supply, fill #1
  Filled 2022-02-24: qty 30, 30d supply, fill #2

## 2021-11-04 MED ORDER — LOSARTAN POTASSIUM 50 MG PO TABS
50.0000 mg | ORAL_TABLET | Freq: Every day | ORAL | 1 refills | Status: DC
  Filled 2021-11-04: qty 90, 90d supply, fill #0
  Filled 2021-12-10: qty 30, 30d supply, fill #0
  Filled 2022-01-16: qty 30, 30d supply, fill #1
  Filled 2022-02-24: qty 30, 30d supply, fill #2
  Filled 2022-03-27: qty 30, 30d supply, fill #3
  Filled 2022-04-29: qty 30, 30d supply, fill #4
  Filled 2022-06-09: qty 30, 30d supply, fill #5

## 2021-11-04 MED ORDER — GLIPIZIDE 5 MG PO TABS
5.0000 mg | ORAL_TABLET | Freq: Two times a day (BID) | ORAL | 1 refills | Status: DC
  Filled 2021-11-04: qty 60, 30d supply, fill #0
  Filled 2022-03-27: qty 180, 90d supply, fill #0
  Filled 2022-03-31: qty 60, 30d supply, fill #0

## 2021-11-04 MED ORDER — METFORMIN HCL 500 MG PO TABS
500.0000 mg | ORAL_TABLET | Freq: Two times a day (BID) | ORAL | 1 refills | Status: DC
  Filled 2021-11-04 – 2022-01-16 (×2): qty 180, 90d supply, fill #0
  Filled 2022-06-09: qty 180, 90d supply, fill #1

## 2021-11-04 NOTE — Progress Notes (Signed)
? ?Assessment & Plan:  ?Jesse Weiss was seen today for diabetes. ? ?Diagnoses and all orders for this visit: ? ?Type 2 diabetes mellitus with hyperglycemia, without long-term current use of insulin (HCC) ?-     POCT glycosylated hemoglobin (Hb A1C) ?-     POCT glucose (manual entry) ?-     metFORMIN (GLUCOPHAGE) 500 MG tablet; Take 1 tablet (500 mg total) by mouth 2 (two) times daily with a meal. ?-     glipiZIDE (GLUCOTROL) 5 MG tablet; Take 1 tablet (5 mg total) by mouth 2 (two) times daily before a meal. ?Continue blood sugar control as discussed in office today, low carbohydrate diet, and regular physical exercise as tolerated, 150 minutes per week (30 min each day, 5 days per week, or 50 min 3 days per week). Keep blood sugar logs with fasting goal of 90-130 mg/dl, post prandial (after you eat) less than 180.  ?For Hypoglycemia: BS <60 and Hyperglycemia BS >400; contact the clinic ASAP. ?Annual eye exams and foot exams are recommended.  ? ?Essential hypertension ?-     losartan (COZAAR) 50 MG tablet; Take 1 tablet (50 mg total) by mouth daily. ?Continue all antihypertensives as prescribed.  ?Remember to bring in your blood pressure log with you for your follow up appointment.  ?DASH/Mediterranean Diets are healthier choices for HTN.   ? ?Dyslipidemia, goal LDL below 70 ?-     atorvastatin (LIPITOR) 20 MG tablet; TAKE 1 TABLET (20 MG TOTAL) BY MOUTH DAILY. ?INSTRUCTIONS: Work on a low fat, heart healthy diet and participate in regular aerobic exercise program by working out at least 150 minutes per week; 5 days a week-30 minutes per day. Avoid red meat/beef/steak,  fried foods. junk foods, sodas, sugary drinks, unhealthy snacking, alcohol and smoking.  Drink at least 80 oz of water per day and monitor your carbohydrate intake daily.   ? ? ? ?Patient has been counseled on age-appropriate routine health concerns for screening and prevention. These are reviewed and up-to-date. Referrals have been placed accordingly.  Immunizations are up-to-date or declined.    ?Subjective:  ? ?Chief Complaint  ?Patient presents with  ? Diabetes  ? ?HPI ?Jesse Weiss 56 y.o. male presents to office today for follow up to DM and HTN ?He has a past medical history of DM2, HPL and Hypertension.  ? ? ?HTN ?Blood pressure is elevated today.  Monitor his blood pressure at home as he does not have a device. Needs to work on dietary adherence and weight loss. If continues elevated will need to increase losartan to 100 mg daily.  ?BP Readings from Last 3 Encounters:  ?11/04/21 (!) 143/93  ?03/01/21 (!) 148/88  ?07/25/20 129/79  ?  ?DM 2 ?Has not been taking 2nd pm dose of metformin or glipizide as instructed. LDL at goal with atorvastatin 20 mg daily  ?Lab Results  ?Component Value Date  ? HGBA1C 7.9 (A) 11/04/2021  ? ?Lab Results  ?Component Value Date  ? LDLCALC 59 10/30/2020  ?  ?Review of Systems  ?Constitutional:  Negative for fever, malaise/fatigue and weight loss.  ?HENT: Negative.  Negative for nosebleeds.   ?Eyes: Negative.  Negative for blurred vision, double vision and photophobia.  ?Respiratory: Negative.  Negative for cough and shortness of breath.   ?Cardiovascular: Negative.  Negative for chest pain, palpitations and leg swelling.  ?Gastrointestinal: Negative.  Negative for heartburn, nausea and vomiting.  ?Musculoskeletal: Negative.  Negative for myalgias.  ?Neurological: Negative.  Negative for dizziness, focal weakness,  seizures and headaches.  ?Psychiatric/Behavioral: Negative.  Negative for suicidal ideas.   ? ?Past Medical History:  ?Diagnosis Date  ? Diabetes mellitus without complication ([place])   ? Hypertension   ? ? ?Past Surgical History:  ?Procedure Laterality Date  ? SHOULDER SURGERY    ? ? ?Family History  ?Problem Relation Age of Onset  ? Hypertension Mother   ? Diabetes Father   ? ? ?Social History Reviewed with no changes to be made today.  ? ?Outpatient Medications Prior to Visit  ?Medication Sig Dispense Refill  ? blood  glucose meter kit and supplies Dispense based on patient and insurance preference. Use up to four times daily as directed. (FOR ICD-9 250.00, 250.01). 1 each 0  ? Blood Glucose Monitoring Suppl (TRUE METRIX METER) w/Device KIT Use as instructed. Check blood glucose level by fingerstick twice per day. 1 kit 0  ? glucose blood (TRUE METRIX BLOOD GLUCOSE TEST) test strip Use as instructed. Check blood glucose level by fingerstick twice per day. 100 each 12  ? atorvastatin (LIPITOR) 20 MG tablet TAKE 1 TABLET (20 MG TOTAL) BY MOUTH DAILY. 90 tablet 0  ? glipiZIDE (GLUCOTROL) 5 MG tablet TAKE 1 TABLET (5 MG TOTAL) BY MOUTH 2 (TWO) TIMES DAILY BEFORE A MEAL. 60 tablet 0  ? losartan (COZAAR) 50 MG tablet TAKE 1 TABLET (50 MG TOTAL) BY MOUTH DAILY. 90 tablet 0  ? metFORMIN (GLUCOPHAGE) 500 MG tablet TAKE 1 TABLET (500 MG TOTAL) BY MOUTH 2 (TWO) TIMES DAILY WITH A MEAL. 180 tablet 0  ? ?No facility-administered medications prior to visit.  ? ? ?Not on File ? ?   ?Objective:  ?  ?BP (!) 143/93   Pulse 79   Resp 16   Ht 5' 11" (1.803 m)   Wt 202 lb 6 oz (91.8 kg)   SpO2 97%   BMI 28.23 kg/m?  ?Wt Readings from Last 3 Encounters:  ?11/04/21 202 lb 6 oz (91.8 kg)  ?03/01/21 202 lb 9.6 oz (91.9 kg)  ?07/25/20 207 lb (93.9 kg)  ? ? ?Physical Exam ?Vitals and nursing note reviewed.  ?Constitutional:   ?   Appearance: He is well-developed.  ?HENT:  ?   Head: Normocephalic and atraumatic.  ?Cardiovascular:  ?   Rate and Rhythm: Normal rate and regular rhythm.  ?   Heart sounds: Normal heart sounds. No murmur heard. ?  No friction rub. No gallop.  ?Pulmonary:  ?   Effort: Pulmonary effort is normal. No tachypnea or respiratory distress.  ?   Breath sounds: Normal breath sounds. No decreased breath sounds, wheezing, rhonchi or rales.  ?Chest:  ?   Chest wall: No tenderness.  ?Abdominal:  ?   General: Bowel sounds are normal.  ?   Palpations: Abdomen is soft.  ?Musculoskeletal:     ?   General: Normal range of motion.  ?    Cervical back: Normal range of motion.  ?Skin: ?   General: Skin is warm and dry.  ?Neurological:  ?   Mental Status: He is alert and oriented to person, place, and time.  ?   Coordination: Coordination normal.  ?Psychiatric:     ?   Behavior: Behavior normal. Behavior is cooperative.     ?   Thought Content: Thought content normal.     ?   Judgment: Judgment normal.  ? ? ? ? ?   ?Patient has been counseled extensively about nutrition and exercise as well as the importance of adherence  with medications and regular follow-up. The patient was given clear instructions to go to ER or return to medical center if symptoms don't improve, worsen or new problems develop. The patient verbalized understanding.  ? ?Follow-up: No follow-ups on file.  ? ?[name], FNP-BC ?[place] ?[place], [place] ?[phone]   ?11/04/2021, 10:20 AM ?

## 2021-11-05 LAB — CMP14+EGFR
ALT: 29 IU/L (ref 0–44)
AST: 20 IU/L (ref 0–40)
Albumin/Globulin Ratio: 1.5 (ref 1.2–2.2)
Albumin: 4.3 g/dL (ref 3.8–4.9)
Alkaline Phosphatase: 80 IU/L (ref 44–121)
BUN/Creatinine Ratio: 13 (ref 9–20)
BUN: 13 mg/dL (ref 6–24)
Bilirubin Total: 0.5 mg/dL (ref 0.0–1.2)
CO2: 21 mmol/L (ref 20–29)
Calcium: 9.9 mg/dL (ref 8.7–10.2)
Chloride: 104 mmol/L (ref 96–106)
Creatinine, Ser: 0.98 mg/dL (ref 0.76–1.27)
Globulin, Total: 2.9 g/dL (ref 1.5–4.5)
Glucose: 157 mg/dL — ABNORMAL HIGH (ref 70–99)
Potassium: 4 mmol/L (ref 3.5–5.2)
Sodium: 140 mmol/L (ref 134–144)
Total Protein: 7.2 g/dL (ref 6.0–8.5)
eGFR: 91 mL/min/{1.73_m2} (ref >59)

## 2021-11-05 LAB — LIPID PANEL
Chol/HDL Ratio: 3.6 ratio (ref 0.0–5.0)
Cholesterol, Total: 115 mg/dL (ref 100–199)
HDL: 32 mg/dL — ABNORMAL LOW (ref >39)
LDL Chol Calc (NIH): 52 mg/dL (ref 0–99)
Triglycerides: 189 mg/dL — ABNORMAL HIGH (ref 0–149)
VLDL Cholesterol Cal: 31 mg/dL (ref 5–40)

## 2021-11-11 ENCOUNTER — Other Ambulatory Visit: Payer: Self-pay

## 2021-12-10 ENCOUNTER — Other Ambulatory Visit: Payer: Self-pay

## 2022-01-16 ENCOUNTER — Other Ambulatory Visit: Payer: Self-pay

## 2022-02-05 ENCOUNTER — Ambulatory Visit: Payer: Self-pay | Admitting: Nurse Practitioner

## 2022-02-24 ENCOUNTER — Other Ambulatory Visit: Payer: Self-pay

## 2022-02-25 ENCOUNTER — Other Ambulatory Visit: Payer: Self-pay

## 2022-03-27 ENCOUNTER — Other Ambulatory Visit: Payer: Self-pay | Admitting: Nurse Practitioner

## 2022-03-27 ENCOUNTER — Other Ambulatory Visit: Payer: Self-pay

## 2022-03-27 DIAGNOSIS — E785 Hyperlipidemia, unspecified: Secondary | ICD-10-CM

## 2022-03-28 ENCOUNTER — Other Ambulatory Visit: Payer: Self-pay

## 2022-03-31 ENCOUNTER — Other Ambulatory Visit: Payer: Self-pay

## 2022-03-31 ENCOUNTER — Other Ambulatory Visit: Payer: Self-pay | Admitting: Pharmacist

## 2022-03-31 DIAGNOSIS — E785 Hyperlipidemia, unspecified: Secondary | ICD-10-CM

## 2022-03-31 DIAGNOSIS — I10 Essential (primary) hypertension: Secondary | ICD-10-CM

## 2022-03-31 MED ORDER — ATORVASTATIN CALCIUM 20 MG PO TABS
ORAL_TABLET | Freq: Every day | ORAL | 0 refills | Status: DC
  Filled 2022-03-31: qty 90, 90d supply, fill #0

## 2022-03-31 MED ORDER — LOSARTAN POTASSIUM 50 MG PO TABS: 50.0000 mg | ORAL_TABLET | Freq: Every day | ORAL | 0 refills | Status: DC

## 2022-04-29 ENCOUNTER — Other Ambulatory Visit: Payer: Self-pay

## 2022-04-30 ENCOUNTER — Other Ambulatory Visit: Payer: Self-pay

## 2022-06-09 ENCOUNTER — Other Ambulatory Visit: Payer: Self-pay

## 2022-06-09 ENCOUNTER — Other Ambulatory Visit: Payer: Self-pay | Admitting: Family Medicine

## 2022-06-09 DIAGNOSIS — E785 Hyperlipidemia, unspecified: Secondary | ICD-10-CM

## 2022-06-16 ENCOUNTER — Other Ambulatory Visit: Payer: Self-pay

## 2022-06-16 ENCOUNTER — Encounter: Payer: Self-pay | Admitting: Nurse Practitioner

## 2022-06-16 ENCOUNTER — Ambulatory Visit: Payer: Self-pay | Attending: Nurse Practitioner | Admitting: Nurse Practitioner

## 2022-06-16 VITALS — BP 136/86 | HR 86 | Temp 98.0°F | Ht 71.0 in | Wt 190.4 lb

## 2022-06-16 DIAGNOSIS — N179 Acute kidney failure, unspecified: Secondary | ICD-10-CM

## 2022-06-16 DIAGNOSIS — E785 Hyperlipidemia, unspecified: Secondary | ICD-10-CM

## 2022-06-16 DIAGNOSIS — E1165 Type 2 diabetes mellitus with hyperglycemia: Secondary | ICD-10-CM

## 2022-06-16 DIAGNOSIS — Z1211 Encounter for screening for malignant neoplasm of colon: Secondary | ICD-10-CM

## 2022-06-16 DIAGNOSIS — I10 Essential (primary) hypertension: Secondary | ICD-10-CM

## 2022-06-16 LAB — GLUCOSE, POCT (MANUAL RESULT ENTRY): POC Glucose: 146 mg/dl — AB (ref 70–99)

## 2022-06-16 LAB — POCT GLYCOSYLATED HEMOGLOBIN (HGB A1C): HbA1c, POC (controlled diabetic range): 6.8 % (ref 0.0–7.0)

## 2022-06-16 MED ORDER — ATORVASTATIN CALCIUM 20 MG PO TABS
20.0000 mg | ORAL_TABLET | Freq: Every day | ORAL | 0 refills | Status: DC
  Filled 2022-06-16 – 2022-07-22 (×2): qty 90, 90d supply, fill #0

## 2022-06-16 MED ORDER — GLIPIZIDE 5 MG PO TABS
5.0000 mg | ORAL_TABLET | Freq: Two times a day (BID) | ORAL | 1 refills | Status: DC
  Filled 2022-06-16: qty 180, 90d supply, fill #0

## 2022-06-16 MED ORDER — LOSARTAN POTASSIUM 50 MG PO TABS
50.0000 mg | ORAL_TABLET | Freq: Every day | ORAL | 1 refills | Status: DC
  Filled 2022-06-16 – 2022-07-22 (×2): qty 90, 90d supply, fill #0
  Filled 2022-11-03: qty 30, 30d supply, fill #1
  Filled 2022-11-03: qty 90, 90d supply, fill #1

## 2022-06-16 MED ORDER — METFORMIN HCL 500 MG PO TABS
500.0000 mg | ORAL_TABLET | Freq: Two times a day (BID) | ORAL | 1 refills | Status: DC
  Filled 2022-06-16: qty 180, 90d supply, fill #0

## 2022-06-16 NOTE — Progress Notes (Signed)
Assessment & Plan:  Kemoni was seen today for diabetes and hypertension.  Diagnoses and all orders for this visit:  Type 2 diabetes mellitus with hyperglycemia, without long-term current use of insulin (HCC) -     POCT glycosylated hemoglobin (Hb A1C) -     POCT glucose (manual entry) -     Microalbumin/Creatinine Ratio, Urine -     CMP14+EGFR -     glipiZIDE (GLUCOTROL) 5 MG tablet; Take 1 tablet (5 mg total) by mouth 2 (two) times daily before a meal. -     metFORMIN (GLUCOPHAGE) 500 MG tablet; Take 1 tablet (500 mg total) by mouth 2 (two) times daily with a meal. Continue blood sugar control as discussed in office today, low carbohydrate diet, and regular physical exercise as tolerated, 150 minutes per week (30 min each day, 5 days per week, or 50 min 3 days per week). Keep blood sugar logs with fasting goal of 90-130 mg/dl, post prandial (after you eat) less than 180.  For Hypoglycemia: BS <60 and Hyperglycemia BS >400; contact the clinic ASAP. Annual eye exams and foot exams are recommended.   Essential hypertension -     losartan (COZAAR) 50 MG tablet; Take 1 tablet (50 mg total) by mouth daily. Continue all antihypertensives as prescribed.  Reminded to bring in blood pressure log for follow  up appointment.  RECOMMENDATIONS: DASH/Mediterranean Diets are healthier choices for HTN.    AKI (acute kidney injury) ([place]) -     CBC with Differential  Dyslipidemia, goal LDL below 70 -     atorvastatin (LIPITOR) 20 MG tablet; TAKE 1 TABLET (20 MG TOTAL) BY MOUTH DAILY. INSTRUCTIONS: Work on a low fat, heart healthy diet and participate in regular aerobic exercise program by working out at least 150 minutes per week; 5 days a week-30 minutes per day. Avoid red meat/beef/steak,  fried foods. junk foods, sodas, sugary drinks, unhealthy snacking, alcohol and smoking.  Drink at least 80 oz of water per day and monitor your carbohydrate intake daily.    Colon cancer screening -     Fecal  occult blood, imunochemical([facility])    Patient has been counseled on age-appropriate routine health concerns for screening and prevention. These are reviewed and up-to-date. Referrals have been placed accordingly. Immunizations are up-to-date or declined.    Subjective:   Chief Complaint  Patient presents with   Diabetes   Hypertension    Jesse Weiss 56 y.o. male presents to office today for follow up to DM and HTN  He has a past medical history of DM 2 and Hypertension.    HTN Blood pressure is well controlled. He is taking losartan 50 mg daily as prescribed.  BP Readings from Last 3 Encounters:  06/16/22 136/86  11/04/21 (!) 143/93  03/01/21 (!) 148/88      DM Well controlled with metformin 500 mg BID and glipizide 5 mg BID. LDL at goal with atorvastatin 20 mg daily.  Lab Results  Component Value Date   HGBA1C 6.8 06/16/2022    Lab Results  Component Value Date   HGBA1C 7.9 (A) 11/04/2021    Lab Results  Component Value Date   LDLCALC 52 11/04/2021     Review of Systems  Constitutional:  Negative for fever, malaise/fatigue and weight loss.  HENT: Negative.  Negative for nosebleeds.   Eyes: Negative.  Negative for blurred vision, double vision and photophobia.  Respiratory: Negative.  Negative for cough and shortness of breath.   Cardiovascular: Negative.  Negative for chest pain, palpitations and leg swelling.  Gastrointestinal: Negative.  Negative for heartburn, nausea and vomiting.  Musculoskeletal: Negative.  Negative for myalgias.  Neurological: Negative.  Negative for dizziness, focal weakness, seizures and headaches.  Psychiatric/Behavioral: Negative.  Negative for suicidal ideas.     Past Medical History:  Diagnosis Date   Diabetes mellitus without complication ([place])    Hypertension     Past Surgical History:  Procedure Laterality Date   SHOULDER SURGERY      Family History  Problem Relation Age of Onset   Hypertension Mother     Diabetes Father     Social History Reviewed with no changes to be made today.   Outpatient Medications Prior to Visit  Medication Sig Dispense Refill   blood glucose meter kit and supplies Dispense based on patient and insurance preference. Use up to four times daily as directed. (FOR ICD-9 250.00, 250.01). 1 each 0   Blood Glucose Monitoring Suppl (TRUE METRIX METER) w/Device KIT Use as instructed. Check blood glucose level by fingerstick twice per day. 1 kit 0   glucose blood (TRUE METRIX BLOOD GLUCOSE TEST) test strip Use as instructed. Check blood glucose level by fingerstick twice per day. 100 each 12   atorvastatin (LIPITOR) 20 MG tablet TAKE 1 TABLET (20 MG TOTAL) BY MOUTH DAILY. 90 tablet 0   glipiZIDE (GLUCOTROL) 5 MG tablet Take 1 tablet (5 mg total) by mouth 2 (two) times daily before a meal. 180 tablet 1   losartan (COZAAR) 50 MG tablet Take 1 tablet (50 mg total) by mouth daily. 90 tablet 1   losartan (COZAAR) 50 MG tablet Take 1 tablet (50 mg total) by mouth daily. 90 tablet 0   metFORMIN (GLUCOPHAGE) 500 MG tablet Take 1 tablet (500 mg total) by mouth 2 (two) times daily with a meal. 180 tablet 1   No facility-administered medications prior to visit.    No Known Allergies     Objective:    BP 136/86   Pulse 86   Temp 98 F (36.7 C) (Temporal)   Ht 5' 11"  (1.803 m)   Wt 190 lb 6.4 oz (86.4 kg)   SpO2 98%   BMI 26.56 kg/m  Wt Readings from Last 3 Encounters:  06/16/22 190 lb 6.4 oz (86.4 kg)  11/04/21 202 lb 6 oz (91.8 kg)  03/01/21 202 lb 9.6 oz (91.9 kg)    Physical Exam Vitals and nursing note reviewed.  Constitutional:      Appearance: He is well-developed.  HENT:     Head: Normocephalic and atraumatic.  Cardiovascular:     Rate and Rhythm: Normal rate and regular rhythm.     Heart sounds: Normal heart sounds. No murmur heard.    No friction rub. No gallop.  Pulmonary:     Effort: Pulmonary effort is normal. No tachypnea or respiratory distress.      Breath sounds: Normal breath sounds. No decreased breath sounds, wheezing, rhonchi or rales.  Chest:     Chest wall: No tenderness.  Abdominal:     General: Bowel sounds are normal.     Palpations: Abdomen is soft.  Musculoskeletal:        General: Normal range of motion.     Cervical back: Normal range of motion.  Skin:    General: Skin is warm and dry.  Neurological:     Mental Status: He is alert and oriented to person, place, and time.     Coordination: Coordination normal.  Psychiatric:        Behavior: Behavior normal. Behavior is cooperative.        Thought Content: Thought content normal.        Judgment: Judgment normal.          Patient has been counseled extensively about nutrition and exercise as well as the importance of adherence with medications and regular follow-up. The patient was given clear instructions to go to ER or return to medical center if symptoms don't improve, worsen or new problems develop. The patient verbalized understanding.   Follow-up: Return in about 4 months (around 10/17/2022).   [name], FNP-BC [facility] and [place], [place]   06/16/2022, 4:45 PM

## 2022-06-17 LAB — CBC WITH DIFFERENTIAL/PLATELET
Basophils Absolute: 0 10*3/uL (ref 0.0–0.2)
Basos: 1 %
EOS (ABSOLUTE): 0.1 10*3/uL (ref 0.0–0.4)
Eos: 2 %
Hematocrit: 43.1 % (ref 37.5–51.0)
Hemoglobin: 14.3 g/dL (ref 13.0–17.7)
Immature Grans (Abs): 0 10*3/uL (ref 0.0–0.1)
Immature Granulocytes: 0 %
Lymphocytes Absolute: 2 10*3/uL (ref 0.7–3.1)
Lymphs: 35 %
MCH: 30.3 pg (ref 26.6–33.0)
MCHC: 33.2 g/dL (ref 31.5–35.7)
MCV: 91 fL (ref 79–97)
Monocytes Absolute: 0.4 10*3/uL (ref 0.1–0.9)
Monocytes: 8 %
Neutrophils Absolute: 3.2 10*3/uL (ref 1.4–7.0)
Neutrophils: 54 %
Platelets: 266 10*3/uL (ref 150–450)
RBC: 4.72 x10E6/uL (ref 4.14–5.80)
RDW: 12.8 % (ref 11.6–15.4)
WBC: 5.8 10*3/uL (ref 3.4–10.8)

## 2022-06-17 LAB — CMP14+EGFR
ALT: 20 IU/L (ref 0–44)
AST: 18 IU/L (ref 0–40)
Albumin/Globulin Ratio: 1.3 (ref 1.2–2.2)
Albumin: 3.9 g/dL (ref 3.8–4.9)
Alkaline Phosphatase: 63 IU/L (ref 44–121)
BUN/Creatinine Ratio: 13 (ref 9–20)
BUN: 12 mg/dL (ref 6–24)
Bilirubin Total: 0.3 mg/dL (ref 0.0–1.2)
CO2: 24 mmol/L (ref 20–29)
Calcium: 9.3 mg/dL (ref 8.7–10.2)
Chloride: 105 mmol/L (ref 96–106)
Creatinine, Ser: 0.89 mg/dL (ref 0.76–1.27)
Globulin, Total: 3 g/dL (ref 1.5–4.5)
Glucose: 154 mg/dL — ABNORMAL HIGH (ref 70–99)
Potassium: 3.8 mmol/L (ref 3.5–5.2)
Sodium: 140 mmol/L (ref 134–144)
Total Protein: 6.9 g/dL (ref 6.0–8.5)
eGFR: 101 mL/min/{1.73_m2} (ref >59)

## 2022-06-17 LAB — MICROALBUMIN / CREATININE URINE RATIO
Creatinine, Urine: 316.3 mg/dL
Microalb/Creat Ratio: 5 mg/g creat (ref 0–29)
Microalbumin, Urine: 16.4 ug/mL

## 2022-06-23 ENCOUNTER — Other Ambulatory Visit: Payer: Self-pay

## 2022-07-22 ENCOUNTER — Other Ambulatory Visit: Payer: Self-pay

## 2022-07-25 ENCOUNTER — Other Ambulatory Visit: Payer: Self-pay

## 2022-08-19 ENCOUNTER — Other Ambulatory Visit: Payer: Self-pay

## 2022-10-20 ENCOUNTER — Encounter: Payer: Self-pay | Admitting: Nurse Practitioner

## 2022-10-20 ENCOUNTER — Ambulatory Visit: Payer: Self-pay | Attending: Nurse Practitioner | Admitting: Nurse Practitioner

## 2022-10-20 VITALS — BP 155/99 | HR 75 | Ht 71.0 in | Wt 194.6 lb

## 2022-10-20 DIAGNOSIS — I10 Essential (primary) hypertension: Secondary | ICD-10-CM

## 2022-10-20 DIAGNOSIS — E1165 Type 2 diabetes mellitus with hyperglycemia: Secondary | ICD-10-CM

## 2022-10-20 NOTE — Progress Notes (Signed)
Assessment & Plan:  Jesse Weiss Weiss was seen today for hypertension.  Diagnoses and all orders for this visit:  Primary hypertension -     CMP14+EGFR Continue all antihypertensives as prescribed.  Reminded to bring in blood pressure log for follow  up appointment.  RECOMMENDATIONS: DASH/Mediterranean Diets are healthier choices for HTN.    Type 2 diabetes mellitus with hyperglycemia, without long-term current use of insulin (HCC) -     Hemoglobin A1c -     CMP14+EGFR Continue blood sugar control as discussed in office today, low carbohydrate diet, and regular physical exercise as tolerated, 150 minutes per week (30 min each day, 5 days per week, or 50 min 3 days per week). Keep blood sugar logs with fasting goal of 90-130 mg/dl, post prandial (after you eat) less than 180.  For Hypoglycemia: BS <60 and Hyperglycemia BS >400; contact the clinic ASAP. Annual eye exams and foot exams are recommended.     Patient has been counseled on age-appropriate routine health concerns for screening and prevention. These are reviewed and up-to-date. Referrals have been placed accordingly. Immunizations are up-to-date or declined.    Subjective:   Chief Complaint  Patient presents with   Hypertension   HPI Jesse Weiss Weiss 57 y.o. male presents to office today for follow up to HTN and DM  He has a past medical history of DM 2 and Hypertension.    HTN Blood pressure is elevated today. He does note dietary non adherence Would like to work on diet and if BP continues elevated he is agreeable to increasing losartan.  BP Readings from Last 3 Encounters:  10/20/22 (!) 155/99  06/16/22 136/86  11/04/21 (!) 143/93    DM 2 Diabetes is well controlled with metformin 500 mg BID and glipizide 5 mg BID.  Lab Results  Component Value Date   HGBA1C 6.8 06/16/2022    Review of Systems  Constitutional:  Negative for fever, malaise/fatigue and weight loss.  HENT: Negative.  Negative for nosebleeds.   Eyes:  Negative.  Negative for blurred vision, double vision and photophobia.  Respiratory: Negative.  Negative for cough and shortness of breath.   Cardiovascular: Negative.  Negative for chest pain, palpitations and leg swelling.  Gastrointestinal: Negative.  Negative for heartburn, nausea and vomiting.  Musculoskeletal: Negative.  Negative for myalgias.  Neurological: Negative.  Negative for dizziness, focal weakness, seizures and headaches.  Psychiatric/Behavioral: Negative.  Negative for suicidal ideas.     Past Medical History:  Diagnosis Date   Diabetes mellitus without complication ([place])    Hypertension     Past Surgical History:  Procedure Laterality Date   SHOULDER SURGERY      Family History  Problem Relation Age of Onset   Hypertension Mother    Diabetes Father     Social History Reviewed with no changes to be made today.   Outpatient Medications Prior to Visit  Medication Sig Dispense Refill   atorvastatin (LIPITOR) 20 MG tablet Take 1 tablet (20 mg total) by mouth daily. 90 tablet 0   blood glucose meter kit and supplies Dispense based on patient and insurance preference. Use up to four times daily as directed. (FOR ICD-9 250.00, 250.01). 1 each 0   Blood Glucose Monitoring Suppl (TRUE METRIX METER) w/Device KIT Use as instructed. Check blood glucose level by fingerstick twice per day. 1 kit 0   glipiZIDE (GLUCOTROL) 5 MG tablet Take 1 tablet (5 mg total) by mouth 2 (two) times daily before a meal. 180 tablet  1   glucose blood (TRUE METRIX BLOOD GLUCOSE TEST) test strip Use as instructed. Check blood glucose level by fingerstick twice per day. 100 each 12   losartan (COZAAR) 50 MG tablet Take 1 tablet (50 mg total) by mouth daily. 90 tablet 1   metFORMIN (GLUCOPHAGE) 500 MG tablet Take 1 tablet (500 mg total) by mouth 2 (two) times daily with a meal. 180 tablet 1   No facility-administered medications prior to visit.    No Known Allergies     Objective:    BP (!)  155/99   Pulse 75   Ht 5' 11"$  (1.803 m)   Wt 194 lb 9.6 oz (88.3 kg)   SpO2 98%   BMI 27.14 kg/m  Wt Readings from Last 3 Encounters:  10/20/22 194 lb 9.6 oz (88.3 kg)  06/16/22 190 lb 6.4 oz (86.4 kg)  11/04/21 202 lb 6 oz (91.8 kg)    Physical Exam Vitals and nursing note reviewed.  Constitutional:      Appearance: He is well-developed.  HENT:     Head: Normocephalic and atraumatic.  Cardiovascular:     Rate and Rhythm: Normal rate and regular rhythm.     Heart sounds: Normal heart sounds. No murmur heard.    No friction rub. No gallop.  Pulmonary:     Effort: Pulmonary effort is normal. No tachypnea or respiratory distress.     Breath sounds: Normal breath sounds. No decreased breath sounds, wheezing, rhonchi or rales.  Chest:     Chest wall: No tenderness.  Abdominal:     General: Bowel sounds are normal.     Palpations: Abdomen is soft.  Musculoskeletal:        General: Normal range of motion.     Cervical back: Normal range of motion.  Skin:    General: Skin is warm and dry.  Neurological:     Mental Status: He is alert and oriented to person, place, and time.     Coordination: Coordination normal.  Psychiatric:        Behavior: Behavior normal. Behavior is cooperative.        Thought Content: Thought content normal.        Judgment: Judgment normal.          Patient has been counseled extensively about nutrition and exercise as well as the importance of adherence with medications and regular follow-up. The patient was given clear instructions to go to ER or return to medical center if symptoms don't improve, worsen or new problems develop. The patient verbalized understanding.   Follow-up: Return for 4-6 weeks BP Check me luke or angela.   [name], FNP-BC [facility] and [place], [place]   10/20/2022, 9:34 PM

## 2022-10-21 LAB — CMP14+EGFR
ALT: 21 IU/L (ref 0–44)
AST: 15 IU/L (ref 0–40)
Albumin/Globulin Ratio: 1.3 (ref 1.2–2.2)
Albumin: 3.9 g/dL (ref 3.8–4.9)
Alkaline Phosphatase: 66 IU/L (ref 44–121)
BUN/Creatinine Ratio: 17 (ref 9–20)
BUN: 15 mg/dL (ref 6–24)
Bilirubin Total: 0.3 mg/dL (ref 0.0–1.2)
CO2: 22 mmol/L (ref 20–29)
Calcium: 9.5 mg/dL (ref 8.7–10.2)
Chloride: 106 mmol/L (ref 96–106)
Creatinine, Ser: 0.87 mg/dL (ref 0.76–1.27)
Globulin, Total: 2.9 g/dL (ref 1.5–4.5)
Glucose: 187 mg/dL — ABNORMAL HIGH (ref 70–99)
Potassium: 4 mmol/L (ref 3.5–5.2)
Sodium: 142 mmol/L (ref 134–144)
Total Protein: 6.8 g/dL (ref 6.0–8.5)
eGFR: 101 mL/min/{1.73_m2} (ref >59)

## 2022-10-21 LAB — HEMOGLOBIN A1C
Est. average glucose Bld gHb Est-mCnc: 200 mg/dL
Hgb A1c MFr Bld: 8.6 % — ABNORMAL HIGH (ref 4.8–5.6)

## 2022-10-23 ENCOUNTER — Other Ambulatory Visit: Payer: Self-pay | Admitting: Nurse Practitioner

## 2022-10-23 ENCOUNTER — Other Ambulatory Visit: Payer: Self-pay

## 2022-10-23 DIAGNOSIS — E1165 Type 2 diabetes mellitus with hyperglycemia: Secondary | ICD-10-CM

## 2022-10-23 DIAGNOSIS — E785 Hyperlipidemia, unspecified: Secondary | ICD-10-CM

## 2022-10-23 MED ORDER — ATORVASTATIN CALCIUM 20 MG PO TABS
20.0000 mg | ORAL_TABLET | Freq: Every day | ORAL | 1 refills | Status: DC
  Filled 2022-10-23: qty 90, 90d supply, fill #0
  Filled 2022-11-03: qty 30, 30d supply, fill #0
  Filled 2022-11-03: qty 90, 90d supply, fill #0

## 2022-10-23 MED ORDER — METFORMIN HCL 500 MG PO TABS
500.0000 mg | ORAL_TABLET | Freq: Two times a day (BID) | ORAL | 1 refills | Status: DC
  Filled 2022-10-23: qty 180, 90d supply, fill #0
  Filled 2022-11-03: qty 60, 30d supply, fill #0
  Filled 2022-11-03: qty 180, 90d supply, fill #0

## 2022-10-23 MED ORDER — GLIPIZIDE 10 MG PO TABS
10.0000 mg | ORAL_TABLET | Freq: Two times a day (BID) | ORAL | 1 refills | Status: DC
  Filled 2022-10-23 – 2022-11-03 (×2): qty 60, 30d supply, fill #0
  Filled 2022-11-03: qty 180, 90d supply, fill #0
  Filled 2023-03-12: qty 180, 90d supply, fill #1

## 2022-10-27 ENCOUNTER — Other Ambulatory Visit: Payer: Self-pay

## 2022-11-03 ENCOUNTER — Other Ambulatory Visit: Payer: Self-pay

## 2022-11-03 ENCOUNTER — Other Ambulatory Visit: Payer: Self-pay | Admitting: Nurse Practitioner

## 2022-11-03 NOTE — Progress Notes (Signed)
Patient seen by Darrall Dears, PharmD Candidate on 11/03/2022 while they were picking up prescriptions at [place] at [facility].   Blood pressure today was : 160/108, HR 86; on recheck: 150/102, HR 79  Patient did not take HTN medication yet today but was at the pharmacy to pick up HTN medication   Patient has an automated home blood pressure machine. They report home readings [id number] systolic.   Medication review was performed. They are taking medications as prescribed.   The following barriers to adherence were noted:  - They do not have cost concerns.  - They do not have transportation concerns.  - They do not need assistance obtaining refills.  - They do not occasionally forget to take some of their prescribed medications.  - They do not feel like one/some of their medications make them feel poorly.  - They do not have questions or concerns about their medications.  - They do have follow up scheduled with their primary care provider/cardiologist.   The following interventions were completed:  - Medications were reviewed  - Patient was educated on goal blood pressures and long term health implications of elevated blood pressure  - Patient was counseled on lifestyle modifications to improve blood pressure, including diet, exercise, and sleep.   The patient has follow up scheduled:  PCP: 12/01/2022 with Dr. Elisha Headland, PharmD Candidate   Joseph Art, Pharm.D. PGY-2 Ambulatory Care Pharmacy Resident

## 2022-11-04 ENCOUNTER — Other Ambulatory Visit: Payer: Self-pay

## 2022-11-04 MED ORDER — TRUE METRIX BLOOD GLUCOSE TEST VI STRP
ORAL_STRIP | 3 refills | Status: AC
  Filled 2022-11-04 – 2022-12-30 (×2): qty 100, 50d supply, fill #0

## 2022-11-04 NOTE — Telephone Encounter (Signed)
Requested Prescriptions  Pending Prescriptions Disp Refills   glucose blood (TRUE METRIX BLOOD GLUCOSE TEST) test strip 100 each 3    Sig: Use as instructed. Check blood glucose level by fingerstick twice per day.     Endocrinology: Diabetes - Testing Supplies Passed - 11/03/2022  8:59 AM      Passed - Valid encounter within last 12 months    Recent Outpatient Visits           2 weeks ago Primary hypertension   [place], [place] W, NP   4 months ago Type 2 diabetes mellitus with hyperglycemia, without long-term current use of insulin [facility])   [place], [place] W, NP   1 year ago Type 2 diabetes mellitus with hyperglycemia, without long-term current use of insulin [facility])   [place], [place] W, NP   1 year ago Type 2 diabetes mellitus with hyperglycemia, without long-term current use of insulin [facility])   [place], [place] W, NP   2 years ago Type 2 diabetes mellitus with hyperglycemia, without long-term current use of insulin [facility])   [place], [place] Buff, NP       Future Appointments             In 3 weeks [name], NP [place]

## 2022-11-11 ENCOUNTER — Other Ambulatory Visit: Payer: Self-pay

## 2022-11-21 ENCOUNTER — Ambulatory Visit: Payer: Self-pay

## 2022-12-01 ENCOUNTER — Ambulatory Visit: Payer: Self-pay | Admitting: Nurse Practitioner

## 2022-12-30 ENCOUNTER — Other Ambulatory Visit: Payer: Self-pay

## 2022-12-30 ENCOUNTER — Ambulatory Visit: Payer: Self-pay | Attending: Nurse Practitioner | Admitting: Nurse Practitioner

## 2022-12-30 ENCOUNTER — Ambulatory Visit: Payer: Self-pay

## 2022-12-30 ENCOUNTER — Encounter: Payer: Self-pay | Admitting: Nurse Practitioner

## 2022-12-30 DIAGNOSIS — I10 Essential (primary) hypertension: Secondary | ICD-10-CM

## 2022-12-30 DIAGNOSIS — E785 Hyperlipidemia, unspecified: Secondary | ICD-10-CM

## 2022-12-30 DIAGNOSIS — E1165 Type 2 diabetes mellitus with hyperglycemia: Secondary | ICD-10-CM

## 2022-12-30 MED ORDER — METFORMIN HCL 500 MG PO TABS
500.0000 mg | ORAL_TABLET | Freq: Two times a day (BID) | ORAL | 1 refills | Status: DC
  Filled 2022-12-30 – 2023-01-21 (×2): qty 180, 90d supply, fill #0

## 2022-12-30 MED ORDER — ATORVASTATIN CALCIUM 20 MG PO TABS
20.0000 mg | ORAL_TABLET | Freq: Every day | ORAL | 1 refills | Status: DC
  Filled 2022-12-30 – 2023-01-21 (×2): qty 90, 90d supply, fill #0

## 2022-12-30 MED ORDER — LOSARTAN POTASSIUM 100 MG PO TABS
100.0000 mg | ORAL_TABLET | Freq: Every day | ORAL | 1 refills | Status: DC
  Filled 2022-12-30: qty 90, 90d supply, fill #0

## 2022-12-30 NOTE — Progress Notes (Signed)
Assessment & Plan:  Jesse Weiss was seen today for hypertension.  Diagnoses and all orders for this visit:  Primary hypertension -     losartan (COZAAR) 100 MG tablet; Take 1 tablet (100 mg total) by mouth daily.  Dyslipidemia, goal LDL below 70 -     atorvastatin (LIPITOR) 20 MG tablet; Take 1 tablet (20 mg total) by mouth daily. Please fill as a 90 day supply  Type 2 diabetes mellitus with hyperglycemia, without long-term current use of insulin (HCC) -     metFORMIN (GLUCOPHAGE) 500 MG tablet; Take 1 tablet (500 mg total) by mouth 2 (two) times daily with a meal. Please fill as a 90 day supply    Patient has been counseled on age-appropriate routine health concerns for screening and prevention. These are reviewed and up-to-date. Referrals have been placed accordingly. Immunizations are up-to-date or declined.    Subjective:   Chief Complaint  Patient presents with   Hypertension   HPI Jesse Weiss 57 y.o. male presents to office today for follow up to HTN   He has a past medical history of DM 2 and Hypertension.      HTN Blood pressure is elevated today. He does endorse eating deli meat which I have instructed him contains high amounts of sodium. Taking losartan 50 mg daily as prescribed. Will need to increase at this time due to increased diastolic readings.  BP Readings from Last 3 Encounters:  12/30/22 (!) 135/94  11/03/22 (!) 150/102  10/20/22 (!) 155/99       Review of Systems  Constitutional:  Negative for fever, malaise/fatigue and weight loss.  HENT: Negative.  Negative for nosebleeds.   Eyes: Negative.  Negative for blurred vision, double vision and photophobia.  Respiratory: Negative.  Negative for cough and shortness of breath.   Cardiovascular: Negative.  Negative for chest pain, palpitations and leg swelling.  Gastrointestinal: Negative.  Negative for heartburn, nausea and vomiting.  Musculoskeletal: Negative.  Negative for myalgias.  Neurological: Negative.   Negative for dizziness, focal weakness, seizures and headaches.  Psychiatric/Behavioral: Negative.  Negative for suicidal ideas.     Past Medical History:  Diagnosis Date   Diabetes mellitus without complication (HCC)    Hypertension     Past Surgical History:  Procedure Laterality Date   SHOULDER SURGERY      Family History  Problem Relation Age of Onset   Hypertension Mother    Diabetes Father     Social History Reviewed with no changes to be made today.   Outpatient Medications Prior to Visit  Medication Sig Dispense Refill   blood glucose meter kit and supplies Dispense based on patient and insurance preference. Use up to four times daily as directed. (FOR ICD-9 250.00, 250.01). 1 each 0   Blood Glucose Monitoring Suppl (TRUE METRIX METER) w/Device KIT Use as instructed. Check blood glucose level by fingerstick twice per day. 1 kit 0   glipiZIDE (GLUCOTROL) 10 MG tablet Take 1 tablet (10 mg total) by mouth 2 (two) times daily before a meal. 180 tablet 1   glucose blood (TRUE METRIX BLOOD GLUCOSE TEST) test strip Use as instructed. Check blood glucose level by fingerstick twice per day. 100 each 3   atorvastatin (LIPITOR) 20 MG tablet Take 1 tablet (20 mg total) by mouth daily. 90 tablet 1   losartan (COZAAR) 50 MG tablet Take 1 tablet (50 mg total) by mouth daily. 90 tablet 1   metFORMIN (GLUCOPHAGE) 500 MG tablet Take 1 tablet (  500 mg total) by mouth 2 (two) times daily with a meal. 180 tablet 1   No facility-administered medications prior to visit.    No Known Allergies     Objective:    BP (!) 135/94   Pulse 78   Ht 5\' 11"  (1.803 m)   Wt 195 lb (88.5 kg)   SpO2 99%   BMI 27.20 kg/m  Wt Readings from Last 3 Encounters:  12/30/22 195 lb (88.5 kg)  10/20/22 194 lb 9.6 oz (88.3 kg)  06/16/22 190 lb 6.4 oz (86.4 kg)    Physical Exam Vitals and nursing note reviewed.  Constitutional:      Appearance: He is well-developed.  HENT:     Head: Normocephalic and  atraumatic.  Cardiovascular:     Rate and Rhythm: Normal rate and regular rhythm.     Heart sounds: Normal heart sounds. No murmur heard.    No friction rub. No gallop.  Pulmonary:     Effort: Pulmonary effort is normal. No tachypnea or respiratory distress.     Breath sounds: Normal breath sounds. No decreased breath sounds, wheezing, rhonchi or rales.  Chest:     Chest wall: No tenderness.  Abdominal:     General: Bowel sounds are normal.     Palpations: Abdomen is soft.  Musculoskeletal:        General: Normal range of motion.     Cervical back: Normal range of motion.  Skin:    General: Skin is warm and dry.  Neurological:     Mental Status: He is alert and oriented to person, place, and time.     Coordination: Coordination normal.  Psychiatric:        Behavior: Behavior normal. Behavior is cooperative.        Thought Content: Thought content normal.        Judgment: Judgment normal.          Patient has been counseled extensively about nutrition and exercise as well as the importance of adherence with medications and regular follow-up. The patient was given clear instructions to go to ER or return to medical center if symptoms don't improve, worsen or new problems develop. The patient verbalized understanding.   Follow-up: Return in about 5 weeks (around 02/06/2023) for a1c/htn.   Claiborne Rigg, FNP-BC [facility] [place], [place] [phone]   12/30/2022, 4:17 PM

## 2022-12-31 ENCOUNTER — Other Ambulatory Visit: Payer: Self-pay

## 2023-01-21 ENCOUNTER — Other Ambulatory Visit: Payer: Self-pay

## 2023-02-03 ENCOUNTER — Ambulatory Visit: Payer: Self-pay | Admitting: Nurse Practitioner

## 2023-02-09 ENCOUNTER — Ambulatory Visit: Payer: Self-pay | Admitting: Nurse Practitioner

## 2023-03-12 ENCOUNTER — Other Ambulatory Visit: Payer: Self-pay

## 2023-03-12 ENCOUNTER — Telehealth: Payer: Self-pay | Admitting: Nurse Practitioner

## 2023-03-12 NOTE — Telephone Encounter (Signed)
Copied from CRM [id number]. Topic: Appointment Scheduling - Scheduling Inquiry for Clinic >> Mar 12, 2023 12:06 PM Turkey B wrote: Reason for CRM: pt called in states received message in my chart about an earlier appt that he can't get into to see. Please call back to discuss if earlier appt is available

## 2023-03-16 ENCOUNTER — Other Ambulatory Visit: Payer: Self-pay

## 2023-04-15 ENCOUNTER — Ambulatory Visit: Payer: Self-pay | Attending: Nurse Practitioner | Admitting: Nurse Practitioner

## 2023-04-15 ENCOUNTER — Other Ambulatory Visit: Payer: Self-pay

## 2023-04-15 ENCOUNTER — Encounter: Payer: Self-pay | Admitting: Nurse Practitioner

## 2023-04-15 VITALS — BP 136/83 | HR 71 | Ht 71.0 in | Wt 202.0 lb

## 2023-04-15 DIAGNOSIS — Z7984 Long term (current) use of oral hypoglycemic drugs: Secondary | ICD-10-CM

## 2023-04-15 DIAGNOSIS — Z1211 Encounter for screening for malignant neoplasm of colon: Secondary | ICD-10-CM

## 2023-04-15 DIAGNOSIS — I10 Essential (primary) hypertension: Secondary | ICD-10-CM

## 2023-04-15 DIAGNOSIS — E785 Hyperlipidemia, unspecified: Secondary | ICD-10-CM

## 2023-04-15 DIAGNOSIS — E1165 Type 2 diabetes mellitus with hyperglycemia: Secondary | ICD-10-CM

## 2023-04-15 LAB — POCT GLYCOSYLATED HEMOGLOBIN (HGB A1C): Hemoglobin A1C: 7.5 % — AB (ref 4.0–5.6)

## 2023-04-15 MED ORDER — METFORMIN HCL 500 MG PO TABS
500.0000 mg | ORAL_TABLET | Freq: Two times a day (BID) | ORAL | 1 refills | Status: AC
  Filled 2023-04-15 – 2023-07-28 (×2): qty 180, 90d supply, fill #0

## 2023-04-15 MED ORDER — ATORVASTATIN CALCIUM 20 MG PO TABS
20.0000 mg | ORAL_TABLET | Freq: Every day | ORAL | 1 refills | Status: AC
  Filled 2023-04-15 – 2023-07-28 (×2): qty 90, 90d supply, fill #0

## 2023-04-15 MED ORDER — LOSARTAN POTASSIUM 100 MG PO TABS
100.0000 mg | ORAL_TABLET | Freq: Every day | ORAL | 1 refills | Status: AC
  Filled 2023-04-15 – 2023-07-28 (×2): qty 90, 90d supply, fill #0

## 2023-04-15 MED ORDER — GLIPIZIDE 10 MG PO TABS
10.0000 mg | ORAL_TABLET | Freq: Two times a day (BID) | ORAL | 1 refills | Status: AC
  Filled 2023-04-15 – 2023-07-28 (×2): qty 180, 90d supply, fill #0

## 2023-04-15 NOTE — Progress Notes (Signed)
Assessment & Plan:  Jesse Weiss was seen today for medical management of chronic issues.  Diagnoses and all orders for this visit:  Type 2 diabetes mellitus with hyperglycemia, without long-term current use of insulin  -     POCT glycosylated hemoglobin (Hb A1C) -     CMP14+EGFR -     glipiZIDE (GLUCOTROL) 10 MG tablet; Take 1 tablet (10 mg total) by mouth 2 (two) times daily before a meal. -     metFORMIN (GLUCOPHAGE) 500 MG tablet; Take 1 tablet (500 mg total) by mouth 2 (two) times daily with a meal Continue blood sugar control as discussed in office today, low carbohydrate diet, and regular physical exercise as tolerated, 150 minutes per week (30 min each day, 5 days per week, or 50 min 3 days per week). Keep blood sugar logs with fasting goal of 90-130 mg/dl, post prandial (after you eat) less than 180.  For Hypoglycemia: BS <60 and Hyperglycemia BS >400; contact the clinic ASAP. Annual eye exams and foot exams are recommended.   Primary hypertension -     CMP14+EGFR -     losartan (COZAAR) 100 MG tablet; Take 1 tablet (100 mg total) by mouth daily. Continue all antihypertensives as prescribed.  Reminded to bring in blood pressure log for follow  up appointment.  RECOMMENDATIONS: DASH/Mediterranean Diets are healthier choices for HTN.    Colon cancer screening -     Fecal occult blood, imunochemical([facility])  Dyslipidemia, goal LDL below 70 -     atorvastatin (LIPITOR) 20 MG tablet; Take 1 tablet (20 mg total) by mouth daily INSTRUCTIONS: Work on a low fat, heart healthy diet and participate in regular aerobic exercise program by working out at least 150 minutes per week; 5 days a week-30 minutes per day. Avoid red meat/beef/steak,  fried foods. junk foods, sodas, sugary drinks, unhealthy snacking, alcohol and smoking.  Drink at least 80 oz of water per day and monitor your carbohydrate intake daily.      Patient has been counseled on age-appropriate routine health concerns  for screening and prevention. These are reviewed and up-to-date. Referrals have been placed accordingly. Immunizations are up-to-date or declined.    Subjective:   Chief Complaint  Patient presents with   Medical Management of Chronic Issues   HPI Jesse Weiss 57 y.o. male presents to office today for follow up to HTN  He has a past medical history of DM 2 and Hypertension.    HTN Blood pressure improved. Losartan was increased several months ago from 50 DM 2 mg to 100 mg however he only recently picked up the increased dose last month. He endorses frequent headaches when he is outside working. States blood pressures have been elevated based on his home monitor readings. BP Readings from Last 3 Encounters:  04/15/23 136/83  12/30/22 (!) 135/94  11/03/22 (!) 150/102    DM 2 A1c improved. Down from 8.6  to 7.5. He was not aware his glipizide had been increased from 5 mg to 10 mg and only recently picked the higher dosage a few weeks ago.  He is also taking metformin 500 mg BID Lab Results  Component Value Date   HGBA1C 7.5 (A) 04/15/2023      Cramping Review of Systems  Constitutional:  Negative for fever, malaise/fatigue and weight loss.  HENT: Negative.  Negative for nosebleeds.   Eyes: Negative.  Negative for blurred vision, double vision and photophobia.  Respiratory: Negative.  Negative for cough and shortness of  breath.   Cardiovascular: Negative.  Negative for chest pain, palpitations and leg swelling.  Gastrointestinal: Negative.  Negative for heartburn, nausea and vomiting.  Musculoskeletal: Negative.  Negative for myalgias.  Neurological: Negative.  Negative for dizziness, focal weakness, seizures and headaches.  Psychiatric/Behavioral: Negative.  Negative for suicidal ideas.     Past Medical History:  Diagnosis Date   Diabetes mellitus without complication (HCC)    Hypertension     Past Surgical History:  Procedure Laterality Date   SHOULDER SURGERY       Family History  Problem Relation Age of Onset   Hypertension Mother    Diabetes Father     Social History Reviewed with no changes to be made today.   Outpatient Medications Prior to Visit  Medication Sig Dispense Refill   blood glucose meter kit and supplies Dispense based on patient and insurance preference. Use up to four times daily as directed. (FOR ICD-9 250.00, 250.01). 1 each 0   Blood Glucose Monitoring Suppl (TRUE METRIX METER) w/Device KIT Use as instructed. Check blood glucose level by fingerstick twice per day. 1 kit 0   glucose blood (TRUE METRIX BLOOD GLUCOSE TEST) test strip Use as instructed. Check blood glucose level by fingerstick twice per day. 100 each 3   atorvastatin (LIPITOR) 20 MG tablet Take 1 tablet (20 mg total) by mouth daily 90 tablet 1   glipiZIDE (GLUCOTROL) 10 MG tablet Take 1 tablet (10 mg total) by mouth 2 (two) times daily before a meal. 180 tablet 1   losartan (COZAAR) 100 MG tablet Take 1 tablet (100 mg total) by mouth daily. 90 tablet 1   metFORMIN (GLUCOPHAGE) 500 MG tablet Take 1 tablet (500 mg total) by mouth 2 (two) times daily with a meal. 180 tablet 1   No facility-administered medications prior to visit.    No Known Allergies     Objective:    BP 136/83 (BP Location: Left Arm, Patient Position: Sitting, Cuff Size: Normal)   Pulse 71   Ht 5\' 11"  (1.803 m)   Wt 202 lb (91.6 kg)   SpO2 98%   BMI 28.17 kg/m  Wt Readings from Last 3 Encounters:  04/15/23 202 lb (91.6 kg)  12/30/22 195 lb (88.5 kg)  10/20/22 194 lb 9.6 oz (88.3 kg)    Physical Exam Vitals and nursing note reviewed.  Constitutional:      Appearance: He is well-developed.  HENT:     Head: Normocephalic and atraumatic.  Cardiovascular:     Rate and Rhythm: Normal rate and regular rhythm.     Heart sounds: Normal heart sounds. No murmur heard.    No friction rub. No gallop.  Pulmonary:     Effort: Pulmonary effort is normal. No tachypnea or respiratory  distress.     Breath sounds: Normal breath sounds. No decreased breath sounds, wheezing, rhonchi or rales.  Chest:     Chest wall: No tenderness.  Abdominal:     General: Bowel sounds are normal.     Palpations: Abdomen is soft.  Musculoskeletal:        General: Normal range of motion.     Cervical back: Normal range of motion.  Skin:    General: Skin is warm and dry.  Neurological:     Mental Status: He is alert and oriented to person, place, and time.     Coordination: Coordination normal.  Psychiatric:        Behavior: Behavior normal. Behavior is cooperative.  Thought Content: Thought content normal.        Judgment: Judgment normal.          Patient has been counseled extensively about nutrition and exercise as well as the importance of adherence with medications and regular follow-up. The patient was given clear instructions to go to ER or return to medical center if symptoms don't improve, worsen or new problems develop. The patient verbalized understanding.   Follow-up: Return in about 3 months (around 07/16/2023).   Claiborne Rigg, FNP-BC [facility] [place], [place] [phone]   04/15/2023, 8:14 PM

## 2023-04-16 LAB — CMP14+EGFR
ALT: 19 IU/L (ref 0–44)
AST: 15 IU/L (ref 0–40)
Albumin: 4.1 g/dL (ref 3.8–4.9)
Alkaline Phosphatase: 61 IU/L (ref 44–121)
BUN/Creatinine Ratio: 12 (ref 9–20)
BUN: 13 mg/dL (ref 6–24)
Bilirubin Total: 0.5 mg/dL (ref 0.0–1.2)
CO2: 24 mmol/L (ref 20–29)
Calcium: 9.5 mg/dL (ref 8.7–10.2)
Chloride: 108 mmol/L — ABNORMAL HIGH (ref 96–106)
Creatinine, Ser: 1.08 mg/dL (ref 0.76–1.27)
Globulin, Total: 2.9 g/dL (ref 1.5–4.5)
Glucose: 85 mg/dL (ref 70–99)
Potassium: 4.2 mmol/L (ref 3.5–5.2)
Sodium: 144 mmol/L (ref 134–144)
Total Protein: 7 g/dL (ref 6.0–8.5)
eGFR: 80 mL/min/{1.73_m2} (ref >59)

## 2023-04-27 ENCOUNTER — Other Ambulatory Visit: Payer: Self-pay

## 2023-07-28 ENCOUNTER — Other Ambulatory Visit: Payer: Self-pay
